# Patient Record
Sex: Female | Born: 1970 | Race: Black or African American | Hispanic: No | Marital: Single | Smoking: Never smoker
Health system: Southern US, Community
[De-identification: ages and names within clinical notes are randomized; demographics above are authoritative.]

## PROBLEM LIST (undated history)

## (undated) DIAGNOSIS — H548 Legal blindness, as defined in USA: Secondary | ICD-10-CM

## (undated) DIAGNOSIS — E785 Hyperlipidemia, unspecified: Secondary | ICD-10-CM

## (undated) DIAGNOSIS — F79 Unspecified intellectual disabilities: Secondary | ICD-10-CM

## (undated) DIAGNOSIS — E119 Type 2 diabetes mellitus without complications: Secondary | ICD-10-CM

## (undated) DIAGNOSIS — H269 Unspecified cataract: Secondary | ICD-10-CM

## (undated) DIAGNOSIS — M2060 Acquired deformities of toe(s), unspecified, unspecified foot: Secondary | ICD-10-CM

## (undated) DIAGNOSIS — I10 Essential (primary) hypertension: Secondary | ICD-10-CM

## (undated) DIAGNOSIS — F419 Anxiety disorder, unspecified: Secondary | ICD-10-CM

## (undated) DIAGNOSIS — Q02 Microcephaly: Secondary | ICD-10-CM

## (undated) DIAGNOSIS — M2042 Other hammer toe(s) (acquired), left foot: Secondary | ICD-10-CM

## (undated) DIAGNOSIS — G40909 Epilepsy, unspecified, not intractable, without status epilepticus: Secondary | ICD-10-CM

## (undated) HISTORY — DX: Microcephaly: Q02

## (undated) HISTORY — DX: Epilepsy, unspecified, not intractable, without status epilepticus: G40.909

## (undated) HISTORY — DX: Anxiety disorder, unspecified: F41.9

## (undated) HISTORY — DX: Hyperlipidemia, unspecified: E78.5

## (undated) HISTORY — DX: Unspecified intellectual disabilities: F79

## (undated) HISTORY — PX: NO PAST SURGERIES: SHX2092

## (undated) HISTORY — DX: Type 2 diabetes mellitus without complications: E11.9

## (undated) HISTORY — DX: Unspecified cataract: H26.9

---

## 2005-02-10 ENCOUNTER — Emergency Department: Payer: Self-pay | Admitting: Emergency Medicine

## 2009-07-20 ENCOUNTER — Ambulatory Visit: Payer: Self-pay | Admitting: Internal Medicine

## 2012-06-16 LAB — HM PAP SMEAR: HM Pap smear: NEGATIVE

## 2014-01-09 DIAGNOSIS — E785 Hyperlipidemia, unspecified: Secondary | ICD-10-CM | POA: Insufficient documentation

## 2014-01-09 DIAGNOSIS — I10 Essential (primary) hypertension: Secondary | ICD-10-CM | POA: Insufficient documentation

## 2014-01-09 DIAGNOSIS — E1122 Type 2 diabetes mellitus with diabetic chronic kidney disease: Secondary | ICD-10-CM | POA: Insufficient documentation

## 2014-01-09 DIAGNOSIS — H548 Legal blindness, as defined in USA: Secondary | ICD-10-CM | POA: Insufficient documentation

## 2014-01-09 DIAGNOSIS — G40909 Epilepsy, unspecified, not intractable, without status epilepticus: Secondary | ICD-10-CM | POA: Insufficient documentation

## 2014-01-09 DIAGNOSIS — N182 Chronic kidney disease, stage 2 (mild): Secondary | ICD-10-CM

## 2014-01-09 DIAGNOSIS — F79 Unspecified intellectual disabilities: Secondary | ICD-10-CM | POA: Insufficient documentation

## 2014-06-26 ENCOUNTER — Emergency Department: Payer: Self-pay | Admitting: Emergency Medicine

## 2015-07-26 ENCOUNTER — Encounter: Payer: Self-pay | Admitting: Obstetrics and Gynecology

## 2016-07-01 NOTE — Progress Notes (Signed)
ANNUAL PREVENTATIVE CARE GYN  ENCOUNTER NOTE  Subjective:       Joann Mcconnell is a 45 y.o. G0P0000 female here for a routine annual gynecologic exam.  Current complaints: Joann Mcconnell patient with multiple comorbidities including microcephaly, seizure disorder, mental retardation  Patient is not sexually active. No abnormal uterine bleeding, vaginal discharge or vulvar issues are identified. Bowel function is normal. Bladder incontinence occurs periodically.   Gynecologic History No LMP recorded. Contraception: none Last Pap: 2013 neg/neg.  Last mammogram: due to pts noncompliance with mammo- recommend withholding testing unless palpable abnormality/visual abnormality. Results were: Marland Kitchen.  Obstetric History OB History  Gravida Para Term Preterm AB Living  0 0 0 0 0 0  SAB TAB Ectopic Multiple Live Births  0 0 0 0          Past Medical History:  Diagnosis Date  . Diabetes mellitus without complication (HCC)   . Microcephaly (HCC)   . MR (mental retardation)   . Seizure disorder [name](HCC)     Past Surgical History:  Procedure Laterality Date  . NO PAST SURGERIES      Current Outpatient Prescriptions on File Prior to Visit  Medication Sig Dispense Refill  . cetirizine (ZYRTEC) 10 MG tablet Take 10 mg by mouth daily.    . citalopram (CELEXA) 10 MG tablet Take 10 mg by mouth daily.    Marland Kitchen. docusate sodium (COLACE) 100 MG capsule Take 100 mg by mouth 2 (two) times daily.    Marland Kitchen. glimepiride (AMARYL) 4 MG tablet Take 4 mg by mouth daily with breakfast.    . ibuprofen (ADVIL,MOTRIN) 200 MG tablet Take 200 mg by mouth every 6 (six) hours as needed.    Marland Kitchen. LORazepam (ATIVAN) 1 MG tablet Take 1 mg by mouth every 8 (eight) hours.    . pravastatin (PRAVACHOL) 80 MG tablet Take 80 mg by mouth daily.    Marland Kitchen. tolnaftate (TINACTIN) 1 % powder Apply 1 application topically 2 (two) times daily.    . traZODone (DESYREL) 50 MG tablet Take 50 mg by mouth at bedtime.     No current facility-administered  medications on file prior to visit.     Allergies no known allergies  Social History   Social History  . Marital status: Single    Spouse name: N/A  . Number of children: N/A  . Years of education: N/A   Occupational History  . Not on file.   Social History Main Topics  . Smoking status: Never Smoker  . Smokeless tobacco: Not on file  . Alcohol use No  . Drug use: No  . Sexual activity: No   Other Topics Concern  . Not on file   Social History Narrative  . No narrative on file    Family History  Problem Relation Age of Onset  . Family history unknown: Yes    The following portions of the patient's history were reviewed and updated as appropriate: allergies, current medications, past family history, past medical history, past social history, past surgical history and problem list.  Review of Systems ROS Not obtainable No reported issues from support personnel regarding bowel or bladder dysfunction, vaginal complaints, or abnormal uterine bleeding   Objective:   BP 116/64   Pulse 80   Wt 70 lb (31.8 kg)   LMP 08/19/2013  CONSTITUTIONAL:Disabled patient with microcephaly, compliant with head and neck exam PSYCHIATRIC: Mental retardation changes; response to requests for lying down NEUROLGIC: Abdominal rigidity due to voluntary guarding HENT:  Normocephalic, atraumatic EYES: Conjunctivae and EOM are normal. Pupils are equal, round, and reactive to light. No scleral icterus.  NECK: Normal range of motion, supple, no masses.  Normal thyroid.  SKIN: Skin is warm and dry. No rash noted. Not diaphoretic. No erythema. No pallor. CARDIOVASCULAR: Mild tachycardia rate noted, regular rhythm, no murmur. RESPIRATORY: Clear to auscultation bilaterally. Effort and breath sounds normal, no problems with respiration noted. BREASTS: Symmetric in size. No masses, skin changes, nipple drainage, or lymphadenopathy. ABDOMEN: Soft, normal bowel sounds, no distention noted.  No  tenderness,. Voluntary guarding present BLADDER: Normal PELVIC:  External Genitalia: Normal  BUS: Normal  Vagina: Did not examine  Cervix: Did not examine  Uterus: Did not examine  Adnexa: Did not examine  RV: External Exam NormaI  MUSCULOSKELETAL: Normal range of motion. No tenderness.  No cyanosis, clubbing, or edema.  2+ distal pulses. LYMPHATIC: No Axillary, Supraclavicular, or Inguinal Adenopathy.    Assessment:   Annual gynecologic examination 45 y.o. Contraception: abstinence Normal BMI Problem List Items Addressed This Visit    None    Visit Diagnoses    Well woman exam with routine gynecological exam    -  Primary   Surveillance of contraceptive pill       Microcephaly (HCC)       Seizure disorder (HCC)          Plan:  Pap: Not needed last Pap 2013 Mammogram: Ordered if patient is compliant with testing Stool Guaiac Testing:  Not Indicated Labs: thru pcp Routine preventative health maintenance measures emphasized: With caregivers Return to Clinic - 1 Year   Crystal CaryMiller, [place]  Herold HarmsMartin A Naleah Kofoed, MD  Note: This dictation was prepared with Dragon dictation along with smaller phrase technology. Any transcriptional errors that result from this process are unintentional.

## 2016-07-03 ENCOUNTER — Encounter: Payer: Self-pay | Admitting: Obstetrics and Gynecology

## 2016-07-03 ENCOUNTER — Ambulatory Visit (INDEPENDENT_AMBULATORY_CARE_PROVIDER_SITE_OTHER): Payer: Medicare Other | Admitting: Obstetrics and Gynecology

## 2016-07-03 VITALS — BP 116/64 | HR 80 | Wt <= 1120 oz

## 2016-07-03 DIAGNOSIS — G40909 Epilepsy, unspecified, not intractable, without status epilepticus: Secondary | ICD-10-CM | POA: Diagnosis not present

## 2016-07-03 DIAGNOSIS — Z01419 Encounter for gynecological examination (general) (routine) without abnormal findings: Secondary | ICD-10-CM

## 2016-07-03 DIAGNOSIS — Q02 Microcephaly: Secondary | ICD-10-CM | POA: Diagnosis not present

## 2016-07-03 DIAGNOSIS — Z0000 Encounter for general adult medical examination without abnormal findings: Secondary | ICD-10-CM

## 2016-07-03 NOTE — Patient Instructions (Signed)
1. Mammogram is ordered 2. No Pap smear is obtained (so long as there are no gynecologic issues such as abnormal vaginal bleeding, vaginal discharge, or history of active sexual activity, I am not recommending internal exams with Pap smears due to perceived trauma for this patient) 3. Return in 1 year for follow-up

## 2016-08-06 ENCOUNTER — Other Ambulatory Visit: Payer: Self-pay | Admitting: Obstetrics and Gynecology

## 2016-08-06 DIAGNOSIS — Z1231 Encounter for screening mammogram for malignant neoplasm of breast: Secondary | ICD-10-CM

## 2016-09-09 ENCOUNTER — Ambulatory Visit
Admission: RE | Admit: 2016-09-09 | Discharge: 2016-09-09 | Disposition: A | Discharge status: Home / Self Care | Payer: Medicare Other | Source: Ambulatory Visit | Attending: Obstetrics and Gynecology | Admitting: Obstetrics and Gynecology

## 2016-09-09 DIAGNOSIS — Z1231 Encounter for screening mammogram for malignant neoplasm of breast: Secondary | ICD-10-CM | POA: Insufficient documentation

## 2017-07-07 NOTE — Progress Notes (Signed)
ANNUAL PREVENTATIVE CARE GYN  ENCOUNTER NOTE  Subjective:       Joann Mcconnell is a 46 y.o. G0P0000 female here for a routine annual gynecologic exam.  Current complaints: none  Anselm PancoastRalph Scott patient with multiple comorbidities including microcephaly, seizure disorder, mental retardation  Patient is not sexually active. No abnormal uterine bleeding, vaginal discharge or vulvar issues are identified. Bowel function is normal. Bladder incontinence occurs periodically. Bonita QuinLinda did get her mammogram this year!   Gynecologic History Patient's last menstrual period was 08/19/2013. Contraception: none Last Pap: 2013 neg/neg.  Last mammogram: due to pts noncompliance with mammo- recommend withholding testing unless palpable abnormality/visual abnormality.  Obstetric History OB History  Gravida Para Term Preterm AB Living  0 0 0 0 0 0  SAB TAB Ectopic Multiple Live Births  0 0 0 0          Past Medical History:  Diagnosis Date  . Anxiety   . Diabetes mellitus without complication (HCC)   . Hyperlipemia   . Microcephaly (HCC)   . MR (mental retardation)   . Seizure disorder [facility](HCC)     Past Surgical History:  Procedure Laterality Date  . NO PAST SURGERIES      Current Outpatient Medications on File Prior to Visit  Medication Sig Dispense Refill  . Blood Glucose Monitoring Suppl (GLUCOCOM BLOOD GLUCOSE MONITOR) DEVI 1 each by XX route as directed.    . cetirizine (ZYRTEC) 10 MG tablet Take 10 mg by mouth daily.    . citalopram (CELEXA) 10 MG tablet Take 10 mg by mouth daily.    Marland Kitchen. docusate sodium (COLACE) 100 MG capsule Take 100 mg by mouth 2 (two) times daily.    . feeding supplement, GLUCERNA SHAKE, (GLUCERNA SHAKE) LIQD DRINK 120 ML BY MOUTH 3 TIMES DAILY WITHMEALS AND AT 2PM FOR SNACK. MAY HAVE OTHER SNACK CHOICES 1/2 CHEESE OR MEAT SANDWICH AT 2PM.    . glimepiride (AMARYL) 4 MG tablet Take 4 mg by mouth daily with breakfast.    . glucose blood (BAYER CONTOUR TEST) test strip  Use once daily. To check blood sugar  Dx code: E11.9    . Insulin Glargine (LANTUS SOLOSTAR) 100 UNIT/ML Solostar Pen INJECT 9 UNITS SQ ONCE DAILY AT BEDTIME    . Insulin Pen Needle (EASY TOUCH PEN NEEDLES) 31G X 6 MM MISC USE WITH LANTUS DAILY    . LORazepam (ATIVAN) 1 MG tablet Take 1 mg by mouth every 8 (eight) hours.    Marland Kitchen. losartan (COZAAR) 25 MG tablet TAKE 1 TABLET BY MOUTH ONCE DAILY FOR BP    . pravastatin (PRAVACHOL) 80 MG tablet Take 80 mg by mouth daily.    Marland Kitchen. tolnaftate (TINACTIN) 1 % powder Apply 1 application topically 2 (two) times daily.    . traZODone (DESYREL) 50 MG tablet Take 50 mg by mouth at bedtime.     No current facility-administered medications on file prior to visit.     No Known Allergies  Social History   Socioeconomic History  . Marital status: Single    Spouse name: Not on file  . Number of children: Not on file  . Years of education: Not on file  . Highest education level: Not on file  Social Needs  . Financial resource strain: Not on file  . Food insecurity - worry: Not on file  . Food insecurity - inability: Not on file  . Transportation needs - medical: Not on file  . Transportation needs - non-medical:  Not on file  Occupational History  . Not on file  Tobacco Use  . Smoking status: Never Smoker  . Smokeless tobacco: Never Used  Substance and Sexual Activity  . Alcohol use: No  . Drug use: No  . Sexual activity: No  Other Topics Concern  . Not on file  Social History Narrative  . Not on file    Family History  Family history unknown: Yes    The following portions of the patient's history were reviewed and updated as appropriate: allergies, current medications, past family history, past medical history, past social history, past surgical history and problem list.  Review of Systems ROS Not obtainable No reported issues from support personnel regarding bowel or bladder dysfunction, vaginal complaints, or abnormal uterine bleeding    Objective:   BP (!) 167/99   Pulse 92   Wt 69 lb (31.3 kg)   LMP 08/19/2013  CONSTITUTIONAL:Disabled patient with microcephaly, compliant with head and neck exam PSYCHIATRIC: Mental retardation changes; response to requests for lying down NEUROLGIC: Abdominal rigidity due to voluntary guarding HENT:  Normocephalic, atraumatic EYES:  No scleral icterus.  NECK: Normal range of motion, supple, no masses.  Normal thyroid.  SKIN: Skin is warm and dry. No rash noted. Not diaphoretic. No erythema. No pallor. CARDIOVASCULAR: Mild tachycardia rate noted, regular rhythm, no murmur. RESPIRATORY: Clear to auscultation bilaterally. Effort and breath sounds normal, no problems with respiration noted. BREASTS: Symmetric in size. No masses, skin changes, nipple drainage, or lymphadenopathy. ABDOMEN:  Voluntary guarding present; no palpable masses BLADDER: Normal PELVIC:  External Genitalia: Normal  BUS: Normal  Vagina: Did not examine-unable to because of patient noncompliance  Cervix: Did not examine  Uterus: Did not examine  Adnexa: Did not examine  RV: External Exam NormaI no evidence of rash or irritation MUSCULOSKELETAL: Normal range of motion. No tenderness.  No cyanosis, clubbing, or edema.  Warm and dry extremities LYMPHATIC: No Axillary, Supraclavicular, or Inguinal Adenopathy.    Assessment:   Annual gynecologic examination 46 y.o. Contraception: abstinence Normal BMI Problem List Items Addressed This Visit    None    Visit Diagnoses    Well woman exam with routine gynecological exam    -  Primary   Microcephaly (HCC)       Seizure disorder (HCC)          Plan:  Pap: Not needed last Pap 2013 Mammogram: Ordered if patient is compliant with testing Stool Guaiac Testing:  Not Indicated Labs: thru pcp Routine preventative health maintenance measures emphasized: With caregivers Return to Clinic - 1 Year   Crystal Bealls[place]Miller, [place]  Herold HarmsMartin A Hakim Minniefield, MD  Note: This  dictation was prepared with Dragon dictation along with smaller phrase technology. Any transcriptional errors that result from this process are unintentional.

## 2017-07-08 ENCOUNTER — Ambulatory Visit (INDEPENDENT_AMBULATORY_CARE_PROVIDER_SITE_OTHER): Payer: Medicare Other | Admitting: Obstetrics and Gynecology

## 2017-07-08 ENCOUNTER — Encounter: Payer: Self-pay | Admitting: Obstetrics and Gynecology

## 2017-07-08 VITALS — BP 167/99 | HR 92 | Wt <= 1120 oz

## 2017-07-08 DIAGNOSIS — Z01419 Encounter for gynecological examination (general) (routine) without abnormal findings: Secondary | ICD-10-CM | POA: Diagnosis not present

## 2017-07-08 DIAGNOSIS — Q02 Microcephaly: Secondary | ICD-10-CM | POA: Diagnosis not present

## 2017-07-08 DIAGNOSIS — Z780 Asymptomatic menopausal state: Secondary | ICD-10-CM

## 2017-07-08 DIAGNOSIS — G40909 Epilepsy, unspecified, not intractable, without status epilepticus: Secondary | ICD-10-CM

## 2017-07-08 NOTE — Patient Instructions (Addendum)
1.  No Pap smear is done.  Pap smear is not indicated in the future unless vaginal bleeding or vaginal discharge develops; any exam will need to be done under anesthesia 2.  Mammogram already done 3.  Screening labs are to be obtained to her primary care 4.  Notify this office if any gynecologic or breast issues develop 5.  Return to clinic in 1 year   Health Maintenance for Postmenopausal Women Menopause is a normal process in which your reproductive ability comes to an end. This process happens gradually over a span of months to years, usually between the ages of 66 and 62. Menopause is complete when you have missed 12 consecutive menstrual periods. It is important to talk with your health care provider about some of the most common conditions that affect postmenopausal women, such as heart disease, cancer, and bone loss (osteoporosis). Adopting a healthy lifestyle and getting preventive care can help to promote your health and wellness. Those actions can also lower your chances of developing some of these common conditions. What should I know about menopause? During menopause, you may experience a number of symptoms, such as:  Moderate-to-severe hot flashes.  Night sweats.  Decrease in sex drive.  Mood swings.  Headaches.  Tiredness.  Irritability.  Memory problems.  Insomnia.  Choosing to treat or not to treat menopausal changes is an individual decision that you make with your health care provider. What should I know about hormone replacement therapy and supplements? Hormone therapy products are effective for treating symptoms that are associated with menopause, such as hot flashes and night sweats. Hormone replacement carries certain risks, especially as you become older. If you are thinking about using estrogen or estrogen with progestin treatments, discuss the benefits and risks with your health care provider. What should I know about heart disease and stroke? Heart  disease, heart attack, and stroke become more likely as you age. This may be due, in part, to the hormonal changes that your body experiences during menopause. These can affect how your body processes dietary fats, triglycerides, and cholesterol. Heart attack and stroke are both medical emergencies. There are many things that you can do to help prevent heart disease and stroke:  Have your blood pressure checked at least every 1-2 years. High blood pressure causes heart disease and increases the risk of stroke.  If you are 42-76 years old, ask your health care provider if you should take aspirin to prevent a heart attack or a stroke.  Do not use any tobacco products, including cigarettes, chewing tobacco, or electronic cigarettes. If you need help quitting, ask your health care provider.  It is important to eat a healthy diet and maintain a healthy weight. ? Be sure to include plenty of vegetables, fruits, low-fat dairy products, and lean protein. ? Avoid eating foods that are high in solid fats, added sugars, or salt (sodium).  Get regular exercise. This is one of the most important things that you can do for your health. ? Try to exercise for at least 150 minutes each week. The type of exercise that you do should increase your heart rate and make you sweat. This is known as moderate-intensity exercise. ? Try to do strengthening exercises at least twice each week. Do these in addition to the moderate-intensity exercise.  Know your numbers.Ask your health care provider to check your cholesterol and your blood glucose. Continue to have your blood tested as directed by your health care provider.  What should I  know about cancer screening? There are several types of cancer. Take the following steps to reduce your risk and to catch any cancer development as early as possible. Breast Cancer  Practice breast self-awareness. ? This means understanding how your breasts normally appear and feel. ? It  also means doing regular breast self-exams. Let your health care provider know about any changes, no matter how small.  If you are 24 or older, have a clinician do a breast exam (clinical breast exam or CBE) every year. Depending on your age, family history, and medical history, it may be recommended that you also have a yearly breast X-ray (mammogram).  If you have a family history of breast cancer, talk with your health care provider about genetic screening.  If you are at high risk for breast cancer, talk with your health care provider about having an MRI and a mammogram every year.  Breast cancer (BRCA) gene test is recommended for women who have family members with BRCA-related cancers. Results of the assessment will determine the need for genetic counseling and BRCA1 and for BRCA2 testing. BRCA-related cancers include these types: ? Breast. This occurs in males or females. ? Ovarian. ? Tubal. This may also be called fallopian tube cancer. ? Cancer of the abdominal or pelvic lining (peritoneal cancer). ? Prostate. ? Pancreatic.  Cervical, Uterine, and Ovarian Cancer Your health care provider may recommend that you be screened regularly for cancer of the pelvic organs. These include your ovaries, uterus, and vagina. This screening involves a pelvic exam, which includes checking for microscopic changes to the surface of your cervix (Pap test).  For women ages 21-65, health care providers may recommend a pelvic exam and a Pap test every three years. For women ages 89-65, they may recommend the Pap test and pelvic exam, combined with testing for human papilloma virus (HPV), every five years. Some types of HPV increase your risk of cervical cancer. Testing for HPV may also be done on women of any age who have unclear Pap test results.  Other health care providers may not recommend any screening for nonpregnant women who are considered low risk for pelvic cancer and have no symptoms. Ask your  health care provider if a screening pelvic exam is right for you.  If you have had past treatment for cervical cancer or a condition that could lead to cancer, you need Pap tests and screening for cancer for at least 20 years after your treatment. If Pap tests have been discontinued for you, your risk factors (such as having a new sexual partner) need to be reassessed to determine if you should start having screenings again. Some women have medical problems that increase the chance of getting cervical cancer. In these cases, your health care provider may recommend that you have screening and Pap tests more often.  If you have a family history of uterine cancer or ovarian cancer, talk with your health care provider about genetic screening.  If you have vaginal bleeding after reaching menopause, tell your health care provider.  There are currently no reliable tests available to screen for ovarian cancer.  Lung Cancer Lung cancer screening is recommended for adults 20-19 years old who are at high risk for lung cancer because of a history of smoking. A yearly low-dose CT scan of the lungs is recommended if you:  Currently smoke.  Have a history of at least 30 pack-years of smoking and you currently smoke or have quit within the past 15 years. A  pack-year is smoking an average of one pack of cigarettes per day for one year.  Yearly screening should:  Continue until it has been 15 years since you quit.  Stop if you develop a health problem that would prevent you from having lung cancer treatment.  Colorectal Cancer  This type of cancer can be detected and can often be prevented.  Routine colorectal cancer screening usually begins at age 27 and continues through age 37.  If you have risk factors for colon cancer, your health care provider may recommend that you be screened at an earlier age.  If you have a family history of colorectal cancer, talk with your health care provider about genetic  screening.  Your health care provider may also recommend using home test kits to check for hidden blood in your stool.  A small camera at the end of a tube can be used to examine your colon directly (sigmoidoscopy or colonoscopy). This is done to check for the earliest forms of colorectal cancer.  Direct examination of the colon should be repeated every 5-10 years until age 52. However, if early forms of precancerous polyps or small growths are found or if you have a family history or genetic risk for colorectal cancer, you may need to be screened more often.  Skin Cancer  Check your skin from head to toe regularly.  Monitor any moles. Be sure to tell your health care provider: ? About any new moles or changes in moles, especially if there is a change in a mole's shape or color. ? If you have a mole that is larger than the size of a pencil eraser.  If any of your family members has a history of skin cancer, especially at a young age, talk with your health care provider about genetic screening.  Always use sunscreen. Apply sunscreen liberally and repeatedly throughout the day.  Whenever you are outside, protect yourself by wearing long sleeves, pants, a wide-brimmed hat, and sunglasses.  What should I know about osteoporosis? Osteoporosis is a condition in which bone destruction happens more quickly than new bone creation. After menopause, you may be at an increased risk for osteoporosis. To help prevent osteoporosis or the bone fractures that can happen because of osteoporosis, the following is recommended:  If you are 95-53 years old, get at least 1,000 mg of calcium and at least 600 mg of vitamin D per day.  If you are older than age 38 but younger than age 23, get at least 1,200 mg of calcium and at least 600 mg of vitamin D per day.  If you are older than age 85, get at least 1,200 mg of calcium and at least 800 mg of vitamin D per day.  Smoking and excessive alcohol intake  increase the risk of osteoporosis. Eat foods that are rich in calcium and vitamin D, and do weight-bearing exercises several times each week as directed by your health care provider. What should I know about how menopause affects my mental health? Depression may occur at any age, but it is more common as you become older. Common symptoms of depression include:  Low or sad mood.  Changes in sleep patterns.  Changes in appetite or eating patterns.  Feeling an overall lack of motivation or enjoyment of activities that you previously enjoyed.  Frequent crying spells.  Talk with your health care provider if you think that you are experiencing depression. What should I know about immunizations? It is important that you get  and maintain your immunizations. These include:  Tetanus, diphtheria, and pertussis (Tdap) booster vaccine.  Influenza every year before the flu season begins.  Pneumonia vaccine.  Shingles vaccine.  Your health care provider may also recommend other immunizations. This information is not intended to replace advice given to you by your health care provider. Make sure you discuss any questions you have with your health care provider. Document Released: 09/27/2005 Document Revised: 02/23/2016 Document Reviewed: 05/09/2015 Elsevier Interactive Patient Education  2018 [employer].

## 2017-10-20 ENCOUNTER — Other Ambulatory Visit: Payer: Self-pay | Admitting: Obstetrics and Gynecology

## 2017-10-20 DIAGNOSIS — Z1231 Encounter for screening mammogram for malignant neoplasm of breast: Secondary | ICD-10-CM

## 2017-11-04 ENCOUNTER — Ambulatory Visit
Admission: RE | Admit: 2017-11-04 | Discharge: 2017-11-04 | Disposition: A | Discharge status: Home / Self Care | Payer: Medicare Other | Source: Ambulatory Visit | Attending: Obstetrics and Gynecology | Admitting: Obstetrics and Gynecology

## 2017-11-04 DIAGNOSIS — Z1231 Encounter for screening mammogram for malignant neoplasm of breast: Secondary | ICD-10-CM | POA: Insufficient documentation

## 2018-07-08 NOTE — Progress Notes (Signed)
ANNUAL PREVENTATIVE CARE GYN  ENCOUNTER NOTE  Subjective:       Joann Mcconnell is a 47 y.o. G0P0000 female here for a routine annual gynecologic exam.  Current complaints: none  Joann Mcconnell patient with multiple comorbidities including microcephaly, seizure disorder, mental retardation  Patient is not sexually active. No abnormal uterine bleeding, no vaginal discharge or no vulvar issues have been noted. Bowel function is normal. Bladder incontinence occurs periodically. Joann Mcconnell did get a mammogram this year.   Gynecologic History Patient's last menstrual period was 08/19/2013. Contraception: none Last Pap: 2013 neg/neg.  Last mammogram: 11/04/2017 BI-RADS 1  Obstetric History OB History  Gravida Para Term Preterm AB Living  0 0 0 0 0 0  SAB TAB Ectopic Multiple Live Births  0 0 0 0      Past Medical History:  Diagnosis Date  . Anxiety   . Diabetes mellitus without complication (HCC)   . Hyperlipemia   . Microcephaly (HCC)   . MR (mental retardation)   . Seizure disorder [facility](HCC)     Past Surgical History:  Procedure Laterality Date  . NO PAST SURGERIES      Current Outpatient Medications on File Prior to Visit  Medication Sig Dispense Refill  . Blood Glucose Monitoring Suppl (GLUCOCOM BLOOD GLUCOSE MONITOR) DEVI 1 each by XX route as directed.    . cetirizine (ZYRTEC) 10 MG tablet Take 10 mg by mouth daily.    . citalopram (CELEXA) 10 MG tablet Take 10 mg by mouth daily.    Marland Kitchen. docusate sodium (COLACE) 100 MG capsule Take 100 mg by mouth 2 (two) times daily.    . feeding supplement, GLUCERNA SHAKE, (GLUCERNA SHAKE) LIQD DRINK 120 ML BY MOUTH 3 TIMES DAILY WITHMEALS AND AT 2PM FOR SNACK. MAY HAVE OTHER SNACK CHOICES 1/2 CHEESE OR MEAT SANDWICH AT 2PM.    . glimepiride (AMARYL) 4 MG tablet Take 4 mg by mouth daily with breakfast.    . glucose blood (BAYER CONTOUR TEST) test strip Use once daily. To check blood sugar  Dx code: E11.9    . Insulin Glargine (LANTUS SOLOSTAR) 100  UNIT/ML Solostar Pen INJECT 9 UNITS SQ ONCE DAILY AT BEDTIME    . Insulin Pen Needle (EASY TOUCH PEN NEEDLES) 31G X 6 MM MISC USE WITH LANTUS DAILY    . LORazepam (ATIVAN) 1 MG tablet Take 1 mg by mouth every 8 (eight) hours.    Marland Kitchen. losartan (COZAAR) 25 MG tablet TAKE 1 TABLET BY MOUTH ONCE DAILY FOR BP    . pravastatin (PRAVACHOL) 80 MG tablet Take 80 mg by mouth daily.    Marland Kitchen. tolnaftate (TINACTIN) 1 % powder Apply 1 application topically 2 (two) times daily.    . traZODone (DESYREL) 50 MG tablet Take 50 mg by mouth at bedtime.     No current facility-administered medications on file prior to visit.     No Known Allergies  Social History   Socioeconomic History  . Marital status: Single    Spouse name: Not on file  . Number of children: Not on file  . Years of education: Not on file  . Highest education level: Not on file  Occupational History  . Not on file  Social Needs  . Financial resource strain: Not on file  . Food insecurity:    Worry: Not on file    Inability: Not on file  . Transportation needs:    Medical: Not on file    Non-medical: Not on file  Tobacco Use  . Smoking status: Never Smoker  . Smokeless tobacco: Never Used  Substance and Sexual Activity  . Alcohol use: No  . Drug use: No  . Sexual activity: Never  Lifestyle  . Physical activity:    Days per week: Not on file    Minutes per session: Not on file  . Stress: Not on file  Relationships  . Social connections:    Talks on phone: Not on file    Gets together: Not on file    Attends religious service: Not on file    Active member of club or organization: Not on file    Attends meetings of clubs or organizations: Not on file    Relationship status: Not on file  . Intimate partner violence:    Fear of current or ex partner: Not on file    Emotionally abused: Not on file    Physically abused: Not on file    Forced sexual activity: Not on file  Other Topics Concern  . Not on file  Social History  Narrative  . Not on file    Family History  Family history unknown: Yes    The following portions of the patient's history were reviewed and updated as appropriate: allergies, current medications, past family history, past medical history, past social history, past surgical history and problem list.  Review of Systems ROS Not obtainable No reported issues from support personnel regarding bowel or bladder dysfunction, vaginal complaints, or abnormal uterine bleeding   Objective:   BP (!) 149/84   Wt 73 lb (33.1 kg)   LMP 08/19/2013  CONSTITUTIONAL:Disabled patient with microcephaly, compliant with head and neck exam PSYCHIATRIC: Mental retardation changes; response to requests for lying down with assistance from health care providers NEUROLGIC: Abdominal rigidity due to voluntary guarding HENT:  Normocephalic, atraumatic EYES:  No scleral icterus.  NECK: Normal range of motion, supple, no masses.  Normal thyroid.  SKIN: Skin is warm and dry. No rash noted. Not diaphoretic. No erythema. No pallor. CARDIOVASCULAR: Mild tachycardia rate noted, regular rhythm, no murmur. RESPIRATORY: Clear to auscultation bilaterally. Effort and breath sounds normal, no problems with respiration noted. BREASTS: Symmetric in size. No masses, skin changes, nipple drainage, or lymphadenopathy. ABDOMEN:  Voluntary guarding present; no palpable masses BLADDER: Normal PELVIC:  External Genitalia: Normal  BUS: Normal  Vagina: Did not examine-unable to because of patient noncompliance  Cervix: Did not examine  Uterus: Did not examine  Adnexa: Did not examine  RV: External Exam NormaI no evidence of rash or irritation MUSCULOSKELETAL: Normal range of motion. No tenderness.  No cyanosis, clubbing, or edema.  Warm and dry extremities LYMPHATIC: No Axillary, Supraclavicular, or Inguinal Adenopathy.    Assessment:   Annual gynecologic examination 47 y.o. Contraception: abstinence Normal  BMI Microcephaly Seizure disorder Mental retardation  Plan:  Pap: Not needed last Pap 2013 Mammogram: Completed this year  Stool Guaiac Testing:  Not Indicated Labs: thru pcp Routine preventative health maintenance measures emphasized: With caregivers Return to Clinic - 1 Year  Sheliah Plane [place], CMA   Britt Bottom CMA acting as scribe for Dr. Greggory Keen. I have reviewed, updated, and concur with the information scribed. Herold Harms, MD  Note: This dictation was prepared with Dragon dictation along with smaller phrase technology. Any transcriptional errors that result from this process are unintentional.

## 2018-07-09 ENCOUNTER — Ambulatory Visit (INDEPENDENT_AMBULATORY_CARE_PROVIDER_SITE_OTHER): Payer: Medicare Other | Admitting: Obstetrics and Gynecology

## 2018-07-09 ENCOUNTER — Encounter: Payer: Self-pay | Admitting: Obstetrics and Gynecology

## 2018-07-09 VITALS — BP 149/84 | Wt 73.0 lb

## 2018-07-09 DIAGNOSIS — Q02 Microcephaly: Secondary | ICD-10-CM

## 2018-07-09 DIAGNOSIS — Z780 Asymptomatic menopausal state: Secondary | ICD-10-CM

## 2018-07-09 DIAGNOSIS — Z01419 Encounter for gynecological examination (general) (routine) without abnormal findings: Secondary | ICD-10-CM | POA: Diagnosis not present

## 2018-07-09 DIAGNOSIS — F79 Unspecified intellectual disabilities: Secondary | ICD-10-CM

## 2018-07-09 DIAGNOSIS — Z0000 Encounter for general adult medical examination without abnormal findings: Secondary | ICD-10-CM

## 2018-07-09 DIAGNOSIS — G40909 Epilepsy, unspecified, not intractable, without status epilepticus: Secondary | ICD-10-CM

## 2018-07-09 NOTE — Patient Instructions (Addendum)
1.  Pap smear is not done. 2.  Mammogram already obtained 3.  Screening labs are obtained through primary care 4.  Return in 1 year for annual exam-Dr. [place] Maintenance for Postmenopausal Women Menopause is a normal process in which your reproductive ability comes to an end. This process happens gradually over a span of months to years, usually between the ages of 70 and 57. Menopause is complete when you have missed 12 consecutive menstrual periods. It is important to talk with your health care provider about some of the most common conditions that affect postmenopausal women, such as heart disease, cancer, and bone loss (osteoporosis). Adopting a healthy lifestyle and getting preventive care can help to promote your health and wellness. Those actions can also lower your chances of developing some of these common conditions. What should I know about menopause? During menopause, you may experience a number of symptoms, such as:  Moderate-to-severe hot flashes.  Night sweats.  Decrease in sex drive.  Mood swings.  Headaches.  Tiredness.  Irritability.  Memory problems.  Insomnia.  Choosing to treat or not to treat menopausal changes is an individual decision that you make with your health care provider. What should I know about hormone replacement therapy and supplements? Hormone therapy products are effective for treating symptoms that are associated with menopause, such as hot flashes and night sweats. Hormone replacement carries certain risks, especially as you become older. If you are thinking about using estrogen or estrogen with progestin treatments, discuss the benefits and risks with your health care provider. What should I know about heart disease and stroke? Heart disease, heart attack, and stroke become more likely as you age. This may be due, in part, to the hormonal changes that your body experiences during menopause. These can affect how your body processes  dietary fats, triglycerides, and cholesterol. Heart attack and stroke are both medical emergencies. There are many things that you can do to help prevent heart disease and stroke:  Have your blood pressure checked at least every 1-2 years. High blood pressure causes heart disease and increases the risk of stroke.  If you are 79-61 years old, ask your health care provider if you should take aspirin to prevent a heart attack or a stroke.  Do not use any tobacco products, including cigarettes, chewing tobacco, or electronic cigarettes. If you need help quitting, ask your health care provider.  It is important to eat a healthy diet and maintain a healthy weight. ? Be sure to include plenty of vegetables, fruits, low-fat dairy products, and lean protein. ? Avoid eating foods that are high in solid fats, added sugars, or salt (sodium).  Get regular exercise. This is one of the most important things that you can do for your health. ? Try to exercise for at least 150 minutes each week. The type of exercise that you do should increase your heart rate and make you sweat. This is known as moderate-intensity exercise. ? Try to do strengthening exercises at least twice each week. Do these in addition to the moderate-intensity exercise.  Know your numbers.Ask your health care provider to check your cholesterol and your blood glucose. Continue to have your blood tested as directed by your health care provider.  What should I know about cancer screening? There are several types of cancer. Take the following steps to reduce your risk and to catch any cancer development as early as possible. Breast Cancer  Practice breast self-awareness. ? This means understanding  how your breasts normally appear and feel. ? It also means doing regular breast self-exams. Let your health care provider know about any changes, no matter how small.  If you are 48 or older, have a clinician do a breast exam (clinical breast  exam or CBE) every year. Depending on your age, family history, and medical history, it may be recommended that you also have a yearly breast X-ray (mammogram).  If you have a family history of breast cancer, talk with your health care provider about genetic screening.  If you are at high risk for breast cancer, talk with your health care provider about having an MRI and a mammogram every year.  Breast cancer (BRCA) gene test is recommended for women who have family members with BRCA-related cancers. Results of the assessment will determine the need for genetic counseling and BRCA1 and for BRCA2 testing. BRCA-related cancers include these types: ? Breast. This occurs in males or females. ? Ovarian. ? Tubal. This may also be called fallopian tube cancer. ? Cancer of the abdominal or pelvic lining (peritoneal cancer). ? Prostate. ? Pancreatic.  Cervical, Uterine, and Ovarian Cancer Your health care provider may recommend that you be screened regularly for cancer of the pelvic organs. These include your ovaries, uterus, and vagina. This screening involves a pelvic exam, which includes checking for microscopic changes to the surface of your cervix (Pap test).  For women ages 21-65, health care providers may recommend a pelvic exam and a Pap test every three years. For women ages 43-65, they may recommend the Pap test and pelvic exam, combined with testing for human papilloma virus (HPV), every five years. Some types of HPV increase your risk of cervical cancer. Testing for HPV may also be done on women of any age who have unclear Pap test results.  Other health care providers may not recommend any screening for nonpregnant women who are considered low risk for pelvic cancer and have no symptoms. Ask your health care provider if a screening pelvic exam is right for you.  If you have had past treatment for cervical cancer or a condition that could lead to cancer, you need Pap tests and screening for  cancer for at least 20 years after your treatment. If Pap tests have been discontinued for you, your risk factors (such as having a new sexual partner) need to be reassessed to determine if you should start having screenings again. Some women have medical problems that increase the chance of getting cervical cancer. In these cases, your health care provider may recommend that you have screening and Pap tests more often.  If you have a family history of uterine cancer or ovarian cancer, talk with your health care provider about genetic screening.  If you have vaginal bleeding after reaching menopause, tell your health care provider.  There are currently no reliable tests available to screen for ovarian cancer.  Lung Cancer Lung cancer screening is recommended for adults 11-30 years old who are at high risk for lung cancer because of a history of smoking. A yearly low-dose CT scan of the lungs is recommended if you:  Currently smoke.  Have a history of at least 30 pack-years of smoking and you currently smoke or have quit within the past 15 years. A pack-year is smoking an average of one pack of cigarettes per day for one year.  Yearly screening should:  Continue until it has been 15 years since you quit.  Stop if you develop a health problem  that would prevent you from having lung cancer treatment.  Colorectal Cancer  This type of cancer can be detected and can often be prevented.  Routine colorectal cancer screening usually begins at age 17 and continues through age 61.  If you have risk factors for colon cancer, your health care provider may recommend that you be screened at an earlier age.  If you have a family history of colorectal cancer, talk with your health care provider about genetic screening.  Your health care provider may also recommend using home test kits to check for hidden blood in your stool.  A small camera at the end of a tube can be used to examine your colon  directly (sigmoidoscopy or colonoscopy). This is done to check for the earliest forms of colorectal cancer.  Direct examination of the colon should be repeated every 5-10 years until age 48. However, if early forms of precancerous polyps or small growths are found or if you have a family history or genetic risk for colorectal cancer, you may need to be screened more often.  Skin Cancer  Check your skin from head to toe regularly.  Monitor any moles. Be sure to tell your health care provider: ? About any new moles or changes in moles, especially if there is a change in a mole's shape or color. ? If you have a mole that is larger than the size of a pencil eraser.  If any of your family members has a history of skin cancer, especially at a young age, talk with your health care provider about genetic screening.  Always use sunscreen. Apply sunscreen liberally and repeatedly throughout the day.  Whenever you are outside, protect yourself by wearing long sleeves, pants, a wide-brimmed hat, and sunglasses.  What should I know about osteoporosis? Osteoporosis is a condition in which bone destruction happens more quickly than new bone creation. After menopause, you may be at an increased risk for osteoporosis. To help prevent osteoporosis or the bone fractures that can happen because of osteoporosis, the following is recommended:  If you are 63-31 years old, get at least 1,000 mg of calcium and at least 600 mg of vitamin D per day.  If you are older than age 38 but younger than age 106, get at least 1,200 mg of calcium and at least 600 mg of vitamin D per day.  If you are older than age 60, get at least 1,200 mg of calcium and at least 800 mg of vitamin D per day.  Smoking and excessive alcohol intake increase the risk of osteoporosis. Eat foods that are rich in calcium and vitamin D, and do weight-bearing exercises several times each week as directed by your health care provider. What should I  know about how menopause affects my mental health? Depression may occur at any age, but it is more common as you become older. Common symptoms of depression include:  Low or sad mood.  Changes in sleep patterns.  Changes in appetite or eating patterns.  Feeling an overall lack of motivation or enjoyment of activities that you previously enjoyed.  Frequent crying spells.  Talk with your health care provider if you think that you are experiencing depression. What should I know about immunizations? It is important that you get and maintain your immunizations. These include:  Tetanus, diphtheria, and pertussis (Tdap) booster vaccine.  Influenza every year before the flu season begins.  Pneumonia vaccine.  Shingles vaccine.  Your health care provider may also recommend other  immunizations. This information is not intended to replace advice given to you by your health care provider. Make sure you discuss any questions you have with your health care provider. Document Released: 09/27/2005 Document Revised: 02/23/2016 Document Reviewed: 05/09/2015 Elsevier Interactive Patient Education  2018 [employer].

## 2018-10-19 ENCOUNTER — Other Ambulatory Visit: Payer: Self-pay | Admitting: Internal Medicine

## 2018-10-19 DIAGNOSIS — Z1231 Encounter for screening mammogram for malignant neoplasm of breast: Secondary | ICD-10-CM

## 2018-11-08 ENCOUNTER — Encounter: Payer: Self-pay | Admitting: Emergency Medicine

## 2018-11-08 ENCOUNTER — Other Ambulatory Visit: Payer: Self-pay

## 2018-11-08 ENCOUNTER — Emergency Department: Payer: Medicare Other

## 2018-11-08 ENCOUNTER — Inpatient Hospital Stay
Admission: EM | Admit: 2018-11-08 | Discharge: 2018-11-12 | DRG: 623 | Disposition: A | Discharge status: Nursing Home (Includes ICF) | Payer: Medicare Other | Attending: Internal Medicine | Admitting: Internal Medicine

## 2018-11-08 DIAGNOSIS — A4902 Methicillin resistant Staphylococcus aureus infection, unspecified site: Secondary | ICD-10-CM

## 2018-11-08 DIAGNOSIS — I10 Essential (primary) hypertension: Secondary | ICD-10-CM | POA: Diagnosis present

## 2018-11-08 DIAGNOSIS — E785 Hyperlipidemia, unspecified: Secondary | ICD-10-CM | POA: Diagnosis present

## 2018-11-08 DIAGNOSIS — Z79899 Other long term (current) drug therapy: Secondary | ICD-10-CM

## 2018-11-08 DIAGNOSIS — E1140 Type 2 diabetes mellitus with diabetic neuropathy, unspecified: Secondary | ICD-10-CM | POA: Diagnosis present

## 2018-11-08 DIAGNOSIS — E11621 Type 2 diabetes mellitus with foot ulcer: Principal | ICD-10-CM | POA: Diagnosis present

## 2018-11-08 DIAGNOSIS — L97519 Non-pressure chronic ulcer of other part of right foot with unspecified severity: Secondary | ICD-10-CM | POA: Diagnosis present

## 2018-11-08 DIAGNOSIS — F79 Unspecified intellectual disabilities: Secondary | ICD-10-CM | POA: Diagnosis present

## 2018-11-08 DIAGNOSIS — E11628 Type 2 diabetes mellitus with other skin complications: Secondary | ICD-10-CM | POA: Diagnosis present

## 2018-11-08 DIAGNOSIS — M79671 Pain in right foot: Secondary | ICD-10-CM | POA: Diagnosis present

## 2018-11-08 DIAGNOSIS — Z794 Long term (current) use of insulin: Secondary | ICD-10-CM | POA: Diagnosis not present

## 2018-11-08 DIAGNOSIS — Q02 Microcephaly: Secondary | ICD-10-CM | POA: Diagnosis not present

## 2018-11-08 DIAGNOSIS — E1165 Type 2 diabetes mellitus with hyperglycemia: Secondary | ICD-10-CM | POA: Diagnosis present

## 2018-11-08 DIAGNOSIS — E11649 Type 2 diabetes mellitus with hypoglycemia without coma: Secondary | ICD-10-CM | POA: Diagnosis present

## 2018-11-08 DIAGNOSIS — L02611 Cutaneous abscess of right foot: Secondary | ICD-10-CM | POA: Diagnosis present

## 2018-11-08 DIAGNOSIS — L03116 Cellulitis of left lower limb: Secondary | ICD-10-CM

## 2018-11-08 DIAGNOSIS — M7989 Other specified soft tissue disorders: Secondary | ICD-10-CM | POA: Diagnosis present

## 2018-11-08 DIAGNOSIS — Z993 Dependence on wheelchair: Secondary | ICD-10-CM

## 2018-11-08 HISTORY — DX: Methicillin resistant Staphylococcus aureus infection, unspecified site: A49.02

## 2018-11-08 LAB — BASIC METABOLIC PANEL
Anion gap: 10 (ref 5–15)
BUN: 17 mg/dL (ref 6–20)
CHLORIDE: 101 mmol/L (ref 98–111)
CO2: 29 mmol/L (ref 22–32)
CREATININE: 0.84 mg/dL (ref 0.44–1.00)
Calcium: 9.3 mg/dL (ref 8.9–10.3)
GFR calc Af Amer: >60 mL/min (ref >60)
GFR calc non Af Amer: >60 mL/min (ref >60)
Glucose, Bld: 216 mg/dL — ABNORMAL HIGH (ref 70–99)
POTASSIUM: 3.6 mmol/L (ref 3.5–5.1)
Sodium: 140 mmol/L (ref 135–145)

## 2018-11-08 LAB — CBC WITH DIFFERENTIAL/PLATELET
ABS IMMATURE GRANULOCYTES: 0.1 10*3/uL — AB (ref 0.00–0.07)
BASOS ABS: 0 10*3/uL (ref 0.0–0.1)
Basophils Relative: 0 %
EOS PCT: 0 %
Eosinophils Absolute: 0.1 10*3/uL (ref 0.0–0.5)
HEMATOCRIT: 38.5 % (ref 36.0–46.0)
HEMOGLOBIN: 13 g/dL (ref 12.0–15.0)
IMMATURE GRANULOCYTES: 0 %
LYMPHS ABS: 2.1 10*3/uL (ref 0.7–4.0)
LYMPHS PCT: 9 %
MCH: 34.4 pg — ABNORMAL HIGH (ref 26.0–34.0)
MCHC: 33.8 g/dL (ref 30.0–36.0)
MCV: 101.9 fL — ABNORMAL HIGH (ref 80.0–100.0)
Monocytes Absolute: 2.6 10*3/uL — ABNORMAL HIGH (ref 0.1–1.0)
Monocytes Relative: 10 %
NEUTROS ABS: 19.9 10*3/uL — AB (ref 1.7–7.7)
NEUTROS PCT: 81 %
NRBC: 0 % (ref 0.0–0.2)
Platelets: 211 10*3/uL (ref 150–400)
RBC: 3.78 MIL/uL — ABNORMAL LOW (ref 3.87–5.11)
RDW: 13.1 % (ref 11.5–15.5)
WBC: 24.9 10*3/uL — ABNORMAL HIGH (ref 4.0–10.5)

## 2018-11-08 LAB — GLUCOSE, CAPILLARY
GLUCOSE-CAPILLARY: 160 mg/dL — AB (ref 70–99)
Glucose-Capillary: 221 mg/dL — ABNORMAL HIGH (ref 70–99)

## 2018-11-08 MED ORDER — SODIUM CHLORIDE 0.9% FLUSH
3.0000 mL | Freq: Two times a day (BID) | INTRAVENOUS | Status: DC
  Administered 2018-11-09 – 2018-11-12 (×6): 3 mL via INTRAVENOUS

## 2018-11-08 MED ORDER — INSULIN GLARGINE 100 UNIT/ML ~~LOC~~ SOLN
9.0000 [IU] | Freq: Every day | SUBCUTANEOUS | Status: DC
  Administered 2018-11-08: 9 [IU] via SUBCUTANEOUS
  Filled 2018-11-08 (×2): qty 0.09

## 2018-11-08 MED ORDER — ACETAMINOPHEN 325 MG PO TABS
650.0000 mg | ORAL_TABLET | Freq: Four times a day (QID) | ORAL | Status: DC | PRN
  Administered 2018-11-09: 650 mg via ORAL
  Filled 2018-11-08: qty 2

## 2018-11-08 MED ORDER — INSULIN ASPART 100 UNIT/ML ~~LOC~~ SOLN
0.0000 [IU] | Freq: Three times a day (TID) | SUBCUTANEOUS | Status: DC
  Administered 2018-11-09 – 2018-11-10 (×2): 3 [IU] via SUBCUTANEOUS
  Administered 2018-11-11: 1 [IU] via SUBCUTANEOUS
  Administered 2018-11-11: 5 [IU] via SUBCUTANEOUS
  Administered 2018-11-11: 1 [IU] via SUBCUTANEOUS
  Administered 2018-11-12 (×2): 2 [IU] via SUBCUTANEOUS
  Filled 2018-11-08 (×7): qty 1

## 2018-11-08 MED ORDER — DOCUSATE SODIUM 100 MG PO CAPS
200.0000 mg | ORAL_CAPSULE | Freq: Every day | ORAL | Status: DC
  Administered 2018-11-08 – 2018-11-09 (×2): 200 mg via ORAL
  Filled 2018-11-08 (×2): qty 2

## 2018-11-08 MED ORDER — SODIUM CHLORIDE 0.9% FLUSH: 3.0000 mL | INTRAVENOUS | Status: DC | PRN

## 2018-11-08 MED ORDER — ONDANSETRON HCL 4 MG PO TABS: 4.0000 mg | ORAL_TABLET | Freq: Four times a day (QID) | ORAL | Status: DC | PRN

## 2018-11-08 MED ORDER — PRAVASTATIN SODIUM 20 MG PO TABS
80.0000 mg | ORAL_TABLET | Freq: Every day | ORAL | Status: DC
  Administered 2018-11-09 – 2018-11-12 (×3): 80 mg via ORAL
  Filled 2018-11-08 (×3): qty 4

## 2018-11-08 MED ORDER — KETOROLAC TROMETHAMINE 15 MG/ML IJ SOLN
15.0000 mg | Freq: Four times a day (QID) | INTRAMUSCULAR | Status: DC | PRN
  Administered 2018-11-08 – 2018-11-10 (×4): 15 mg via INTRAVENOUS
  Filled 2018-11-08 (×4): qty 1

## 2018-11-08 MED ORDER — IBUPROFEN 400 MG PO TABS: 400.0000 mg | ORAL_TABLET | Freq: Four times a day (QID) | ORAL | Status: DC | PRN

## 2018-11-08 MED ORDER — LOSARTAN POTASSIUM 25 MG PO TABS
25.0000 mg | ORAL_TABLET | Freq: Every day | ORAL | Status: DC
  Administered 2018-11-09 – 2018-11-12 (×3): 25 mg via ORAL
  Filled 2018-11-08 (×3): qty 1

## 2018-11-08 MED ORDER — DOCUSATE SODIUM 100 MG PO CAPS
100.0000 mg | ORAL_CAPSULE | Freq: Every morning | ORAL | Status: DC
Start: 2018-11-09 — End: 2018-11-09

## 2018-11-08 MED ORDER — CEFTRIAXONE SODIUM 1 G IJ SOLR
1.0000 g | Freq: Once | INTRAMUSCULAR | Status: AC
  Administered 2018-11-08: 1 g via INTRAVENOUS
  Filled 2018-11-08: qty 10

## 2018-11-08 MED ORDER — ONDANSETRON HCL 4 MG/2ML IJ SOLN
4.0000 mg | Freq: Four times a day (QID) | INTRAMUSCULAR | Status: DC | PRN
  Administered 2018-11-12: 4 mg via INTRAVENOUS
  Filled 2018-11-08: qty 2

## 2018-11-08 MED ORDER — LORATADINE 10 MG PO TABS
10.0000 mg | ORAL_TABLET | Freq: Every day | ORAL | Status: DC | PRN
  Administered 2018-11-10: 10 mg via ORAL
  Filled 2018-11-08: qty 1

## 2018-11-08 MED ORDER — TERBINAFINE HCL 1 % EX CREA
1.0000 "application " | TOPICAL_CREAM | Freq: Two times a day (BID) | CUTANEOUS | Status: DC
  Administered 2018-11-08 – 2018-11-11 (×3): 1 via TOPICAL
  Filled 2018-11-08: qty 12

## 2018-11-08 MED ORDER — ENOXAPARIN SODIUM 40 MG/0.4ML ~~LOC~~ SOLN
40.0000 mg | SUBCUTANEOUS | Status: DC
  Administered 2018-11-08: 40 mg via SUBCUTANEOUS
  Filled 2018-11-08: qty 0.4

## 2018-11-08 MED ORDER — SODIUM CHLORIDE 0.9 % IV SOLN
250.0000 mL | INTRAVENOUS | Status: DC | PRN
  Administered 2018-11-09 (×2): 250 mL via INTRAVENOUS

## 2018-11-08 MED ORDER — SODIUM CHLORIDE 0.9 % IV BOLUS
1000.0000 mL | Freq: Once | INTRAVENOUS | Status: AC
  Administered 2018-11-08: 1000 mL via INTRAVENOUS

## 2018-11-08 MED ORDER — VANCOMYCIN HCL 500 MG IV SOLR
500.0000 mg | INTRAVENOUS | Status: DC
  Administered 2018-11-09 – 2018-11-11 (×3): 500 mg via INTRAVENOUS
  Filled 2018-11-08 (×5): qty 500

## 2018-11-08 MED ORDER — SODIUM CHLORIDE 0.9 % IV SOLN
3.0000 g | Freq: Four times a day (QID) | INTRAVENOUS | Status: DC
  Administered 2018-11-09 (×2): 3 g via INTRAVENOUS
  Filled 2018-11-08 (×5): qty 3

## 2018-11-08 MED ORDER — GLIMEPIRIDE 2 MG PO TABS
2.0000 mg | ORAL_TABLET | Freq: Every day | ORAL | Status: DC
  Filled 2018-11-08: qty 1

## 2018-11-08 MED ORDER — LORAZEPAM 2 MG PO TABS: 2.0000 mg | ORAL_TABLET | Freq: Every day | ORAL | Status: DC | PRN

## 2018-11-08 MED ORDER — TRAZODONE HCL 50 MG PO TABS
50.0000 mg | ORAL_TABLET | Freq: Every day | ORAL | Status: DC
  Administered 2018-11-08 – 2018-11-11 (×3): 50 mg via ORAL
  Filled 2018-11-08 (×3): qty 1

## 2018-11-08 MED ORDER — ACETAMINOPHEN 650 MG RE SUPP: 650.0000 mg | Freq: Four times a day (QID) | RECTAL | Status: DC | PRN

## 2018-11-08 MED ORDER — CITALOPRAM HYDROBROMIDE 20 MG PO TABS
10.0000 mg | ORAL_TABLET | Freq: Every day | ORAL | Status: DC
  Administered 2018-11-09 – 2018-11-12 (×3): 10 mg via ORAL
  Filled 2018-11-08 (×3): qty 1

## 2018-11-08 MED ORDER — VANCOMYCIN HCL 500 MG IV SOLR
500.0000 mg | Freq: Once | INTRAVENOUS | Status: AC
  Administered 2018-11-08: 500 mg via INTRAVENOUS
  Filled 2018-11-08: qty 500

## 2018-11-08 NOTE — ED Notes (Signed)
Glucose 221 in triage

## 2018-11-08 NOTE — ED Notes (Signed)
ED TO INPATIENT HANDOFF REPORT  ED Nurse Name and Phone #: Edson Deridder [phone]   S Name/Age/Gender Joann Mcconnell 48 y.o. female Room/Bed: ED18A/ED18A  Code Status   Code Status: Not on file  Home[facility]SNF/Other [facility] Patient oriented to: self Is this baseline? Yes   Triage Complete: Triage complete  Chief Complaint Foot Swelling  Triage Note PT here with caret[facility]. Pt has swelling, redness and abscess noted to RT foot. PT hx of diabetes. PT ambulates at baseline but will not ambulate on affected foot since yesterday. Caretaker states glucose in the 300s since yesterday. NAD noted. VSS   Allergies No Known Allergies  Level of Care/Admitting Diagnosis ED Disposition    ED Disposition Condition Comment   Admit  Hospital Area: [facility] [[id number]]  Level of Care: Med-Surg [16]  Diagnosis: Cellulitis [[id number]]  Admitting Physician: Auburn Bilberry [[id number]]  Attending Physician: Auburn Bilberry [[id number]]  Estimated length of stay: past midnight tomorrow  Certification:: I certify this patient will need inpatient services for at least 2 midnights  PT Class (Do Not Modify): Inpatient [101]  PT Acc Code (Do Not Modify): Private [1]       B Medical/Surgery History Past Medical History:  Diagnosis Date  . Anxiety   . Diabetes mellitus without complication (HCC)   . Hyperlipemia   . Microcephaly (HCC)   . MR (mental retardation)   . Seizure disorder [facility])    Past Surgical History:  Procedure Laterality Date  . NO PAST SURGERIES       A IV Location/Drains/Wounds Patient Lines/Drains/Airways Status   Active Line/Drains/Airways    Name:   Placement date:   Placement time:   Site:   Days:   Peripheral IV 11/08/18 Right Antecubital   11/08/18    1600    Antecubital   less than 1          Intake/Output Last 24 hours No intake or output data in the 24 hours ending 11/08/18 1803  Labs/Imaging Results for  orders placed or performed during the hospital encounter of 11/08/18 (from the past 48 hour(s))  Glucose, capillary     Status: Abnormal   Collection Time: 11/08/18  3:11 PM  Result Value Ref Range   Glucose-Capillary 221 (H) 70 - 99 mg/dL   Comment 1 Notify RN   Basic metabolic panel     Status: Abnormal   Collection Time: 11/08/18  3:57 PM  Result Value Ref Range   Sodium 140 135 - 145 mmol/L   Potassium 3.6 3.5 - 5.1 mmol/L    Comment: HEMOLYSIS AT THIS LEVEL MAY AFFECT RESULT   Chloride 101 98 - 111 mmol/L   CO2 29 22 - 32 mmol/L   Glucose, Bld 216 (H) 70 - 99 mg/dL   BUN 17 6 - 20 mg/dL   Creatinine, Ser 2.81 0.44 - 1.00 mg/dL   Calcium 9.3 8.9 - 18.8 mg/dL   GFR calc non Af Amer >60 >60 mL/min   GFR calc Af Amer >60 >60 mL/min   Anion gap 10 5 - 15    Comment: Performed at [facility], [address] [place], [place] [address]  CBC with Differential     Status: Abnormal   Collection Time: 11/08/18  3:57 PM  Result Value Ref Range   WBC 24.9 (H) 4.0 - 10.5 K/uL   RBC 3.78 (L) 3.87 - 5.11 MIL/uL   Hemoglobin 13.0 12.0 - 15.0 g/dL  HCT 38.5 36.0 - 46.0 %   MCV 101.9 (H) 80.0 - 100.0 fL   MCH 34.4 (H) 26.0 - 34.0 pg   MCHC 33.8 30.0 - 36.0 g/dL   RDW 73.2 20.2 - 54.2 %   Platelets 211 150 - 400 K/uL   nRBC 0.0 0.0 - 0.2 %   Neutrophils Relative % 81 %   Neutro Abs 19.9 (H) 1.7 - 7.7 K/uL   Lymphocytes Relative 9 %   Lymphs Abs 2.1 0.7 - 4.0 K/uL   Monocytes Relative 10 %   Monocytes Absolute 2.6 (H) 0.1 - 1.0 K/uL   Eosinophils Relative 0 %   Eosinophils Absolute 0.1 0.0 - 0.5 K/uL   Basophils Relative 0 %   Basophils Absolute 0.0 0.0 - 0.1 K/uL   Immature Granulocytes 0 %   Abs Immature Granulocytes 0.10 (H) 0.00 - 0.07 K/uL    Comment: Performed at [facility], [address] [place], [place] [address]   Dg Foot 2 Views Right  Result Date: 11/08/2018 CLINICAL DATA:  abscess bottom of right foot(near distal 1st MIP jt). Entire right foot  red and hot to touch Pt has cerebral palsy EXAM: RIGHT FOOT - 2 VIEW COMPARISON:  None. FINDINGS: Osseous alignment is normal. Bone mineralization is normal. No fracture line or displaced fracture fragment seen. No destructive changes, asymmetric demineralization or other signs of osteomyelitis. Presumed soft tissue swelling/edema. No soft tissue gas appreciated. IMPRESSION: 1. No osseous abnormality. No osseous fracture or dislocation. No evidence of osteomyelitis. 2. Presumed soft tissue swelling/edema.  No soft tissue gas seen. Electronically Signed   By: Bary Richard M.D.   On: 11/08/2018 15:57    Pending Labs Unresulted Labs (From admission, onward)    Start     Ordered   11/08/18 1759  Aerobic Culture (superficial specimen)  Once,   STAT     11/08/18 1758   Signed and Held  CBC  (enoxaparin (LOVENOX)    CrCl >/= 30 ml/min)  Once,   R    Comments:  Baseline for enoxaparin therapy IF NOT ALREADY DRAWN.  Notify MD if PLT < 100 K.    Signed and Held   Signed and Held  Creatinine, serum  (enoxaparin (LOVENOX)    CrCl >/= 30 ml/min)  Once,   R    Comments:  Baseline for enoxaparin therapy IF NOT ALREADY DRAWN.    Signed and Held   Signed and Held  Creatinine, serum  (enoxaparin (LOVENOX)    CrCl >/= 30 ml/min)  Weekly,   R    Comments:  while on enoxaparin therapy    Signed and Held          Vitals/Pain Today's Vitals   11/08/18 1508 11/08/18 1646  BP: (!) 162/100   Pulse: (!) 114 97  Resp: 16   Temp: 98.3 F (36.8 C)   TempSrc: Axillary   SpO2: 98% 100%    Isolation Precautions No active isolations  Medications Medications  cefTRIAXone (ROCEPHIN) 1 g in sodium chloride 0.9 % 100 mL IVPB (1 g Intravenous New Bag/Given 11/08/18 1743)  insulin aspart (novoLOG) injection 0-9 Units (has no administration in time range)  sodium chloride 0.9 % bolus 1,000 mL (0 mLs Intravenous Stopped 11/08/18 1731)  vancomycin (VANCOCIN) 500 mg in sodium chloride 0.9 % 100 mL IVPB (0 mg  Intravenous Stopped 11/08/18 1729)    Mobility manual wheelchair Low fall risk   Focused Assessments cellulitis/abscess   R Recommendations: See Admitting  Provider Note  Report given to:   Additional Notes:

## 2018-11-08 NOTE — Consult Note (Signed)
Reason for Consult: Cellulitis with abscess right foot. Referring Physician: Megyn Grimes is an 48 y.o. female.  HPI: This is a 48 year old diabetic female nonverbal who lives in a group home with recent development of some redness and swelling in her right foot.  Inability to bear weight for the last day or 2.  Presented to the emergency department and was admitted for cellulitis with abscess and significantly elevated white count.  Past Medical History:  Diagnosis Date  . Anxiety   . Diabetes mellitus without complication (HCC)   . Hyperlipemia   . Microcephaly (HCC)   . MR (mental retardation)   . Seizure disorder [facility])     Past Surgical History:  Procedure Laterality Date  . NO PAST SURGERIES      Family History  Family history unknown: Yes    Social History:  reports that she has never smoked. She has never used smokeless tobacco. She reports that she does not drink alcohol or use drugs.  Allergies: No Known Allergies  Medications:  Scheduled: . [START ON 11/09/2018] insulin aspart  0-9 Units Subcutaneous TID WC    Results for orders placed or performed during the hospital encounter of 11/08/18 (from the past 48 hour(s))  Glucose, capillary     Status: Abnormal   Collection Time: 11/08/18  3:11 PM  Result Value Ref Range   Glucose-Capillary 221 (H) 70 - 99 mg/dL   Comment 1 Notify RN   Basic metabolic panel     Status: Abnormal   Collection Time: 11/08/18  3:57 PM  Result Value Ref Range   Sodium 140 135 - 145 mmol/L   Potassium 3.6 3.5 - 5.1 mmol/L    Comment: HEMOLYSIS AT THIS LEVEL MAY AFFECT RESULT   Chloride 101 98 - 111 mmol/L   CO2 29 22 - 32 mmol/L   Glucose, Bld 216 (H) 70 - 99 mg/dL   BUN 17 6 - 20 mg/dL   Creatinine, Ser 7.41 0.44 - 1.00 mg/dL   Calcium 9.3 8.9 - 42.3 mg/dL   GFR calc non Af Amer >60 >60 mL/min   GFR calc Af Amer >60 >60 mL/min   Anion gap 10 5 - 15    Comment: Performed at [facility], [address]  [place], [place] [address]  CBC with Differential     Status: Abnormal   Collection Time: 11/08/18  3:57 PM  Result Value Ref Range   WBC 24.9 (H) 4.0 - 10.5 K/uL   RBC 3.78 (L) 3.87 - 5.11 MIL/uL   Hemoglobin 13.0 12.0 - 15.0 g/dL   HCT 23.3 43.5 - 68.6 %   MCV 101.9 (H) 80.0 - 100.0 fL   MCH 34.4 (H) 26.0 - 34.0 pg   MCHC 33.8 30.0 - 36.0 g/dL   RDW 16.8 37.2 - 90.2 %   Platelets 211 150 - 400 K/uL   nRBC 0.0 0.0 - 0.2 %   Neutrophils Relative % 81 %   Neutro Abs 19.9 (H) 1.7 - 7.7 K/uL   Lymphocytes Relative 9 %   Lymphs Abs 2.1 0.7 - 4.0 K/uL   Monocytes Relative 10 %   Monocytes Absolute 2.6 (H) 0.1 - 1.0 K/uL   Eosinophils Relative 0 %   Eosinophils Absolute 0.1 0.0 - 0.5 K/uL   Basophils Relative 0 %   Basophils Absolute 0.0 0.0 - 0.1 K/uL   Immature Granulocytes 0 %   Abs Immature Granulocytes 0.10 (H) 0.00 - 0.07 K/uL  Comment: Performed at [facility], [address] [place], [place] [address]    Dg Foot 2 Views Right  Result Date: 11/08/2018 CLINICAL DATA:  abscess bottom of right foot(near distal 1st MIP jt). Entire right foot red and hot to touch Pt has cerebral palsy EXAM: RIGHT FOOT - 2 VIEW COMPARISON:  None. FINDINGS: Osseous alignment is normal. Bone mineralization is normal. No fracture line or displaced fracture fragment seen. No destructive changes, asymmetric demineralization or other signs of osteomyelitis. Presumed soft tissue swelling/edema. No soft tissue gas appreciated. IMPRESSION: 1. No osseous abnormality. No osseous fracture or dislocation. No evidence of osteomyelitis. 2. Presumed soft tissue swelling/edema.  No soft tissue gas seen. Electronically Signed   By: Bary Richard M.D.   On: 11/08/2018 15:57    Review of Systems  Unable to perform ROS: Patient nonverbal   Blood pressure (!) 162/100, pulse 86, temperature 98.3 F (36.8 C), temperature source Axillary, resp. rate 16, height 4\' 9"  (1.448 m), weight 34.5 kg, last menstrual period  08/19/2013, SpO2 100 %. Physical Exam  Cardiovascular:  DP and PT pulses are palpable bilateral.  Musculoskeletal:     Comments: Adequate range of motion in the pedal joints with guarding on the right.  Some contracture noted.  Muscle testing is deferred.  Neurological:  Unable to fully assess but patient appears to be intact as far sensation and does exhibit a significant pain response in the right foot.  Skin:  The skin is warm dry and supple.  There is some significant erythema and edema in the right forefoot.  A large intact blister is present on the plantar aspect of the right foot pre-debridement.  Post debridement there is a large granular superficial ulceration approximately 4 cm x 3 cm with irregular borders.  No clear deep extension or expressible abscess.    Assessment/Plan: Assessment: 1.  Cellulitis with abscess right foot. 2.  Superficial ulceration right forefoot. 3.  Diabetes.  Plan: Debrided devitalized tissue from the abscessed and ulcerative area on the right forefoot excisionally and sharply using tissue nippers including just superficial tissue.  A culture was taken for sensitivities from the deeper aspect.  A saline wet-to-dry gauze dressing was applied to the wound on the right foot.  At this point we will see how she responds to the debridement and antibiotics and reassess her wound tomorrow prior to deciding on any type of further imaging studies such as an MRI.  Follow-up tomorrow.  Ricci Barker 11/08/2018, 8:39 PM

## 2018-11-08 NOTE — ED Notes (Signed)
Report given and pt ready to go up

## 2018-11-08 NOTE — ED Triage Notes (Signed)
PT here with caretaker from [facility]. Pt has swelling, redness and abscess noted to RT foot. PT hx of diabetes. PT ambulates at baseline but will not ambulate on affected foot since yesterday. Caretaker states glucose in the 300s since yesterday. NAD noted. VSS

## 2018-11-08 NOTE — Progress Notes (Signed)
Pharmacy Antibiotic Note  Joann Mcconnell is a 48 y.o. female admitted on 11/08/2018 with cellulitis.  Pharmacy has been consulted for Vancomycin, Unasyn dosing.  Plan: Ceftriaxone 1 gm IV X 1 given in ED on 3/22 @ 1800.  Unasyn 3 gm IV Q6H ordered to start on 3/23 @ 0000.   Vancomycin 500 mg IV X 1 given in ED on 3/22 @ 1620. Vancomycin 500 mg IV Q24H ordered to start on 3/23 @ 1600. No peak and trough currently ordered.  Vd = 24.8 L Ke = 0.04 hr-1 T1/2 = 16.9 hrs  AUC = 490.7 Cmax = 32.1 Cmin = 12.3   Height: 4\' 9"  (144.8 cm) Weight: 76 lb (34.5 kg) IBW/kg (Calculated) : 38.6  Temp (24hrs), Avg:98.3 F (36.8 C), Min:98.3 F (36.8 C), Max:98.3 F (36.8 C)  Recent Labs  Lab 11/08/18 1557  WBC 24.9*  CREATININE 0.84    Estimated Creatinine Clearance: 44.1 mL/min (by C-G formula based on SCr of 0.84 mg/dL).    No Known Allergies  Antimicrobials this admission:   >>    >>   Dose adjustments this admission:   Microbiology results:  BCx:   UCx:    Sputum:    MRSA PCR:   Thank you for allowing pharmacy to be a part of this patient's care.  Tenzin Pavon D 11/08/2018 7:38 PM

## 2018-11-08 NOTE — H&P (Signed)
[facility]   PATIENT NAME: Joann Mcconnell    MR#:  [id number]  DATE OF BIRTH:  11-14-1970  DATE OF ADMISSION:  11/08/2018  PRIMARY CARE PHYSICIAN: Lauro Regulus, MD   REQUESTING/REFERRING PHYSICIAN: Sharman Cheek, MD  CHIEF COMPLAINT:   Chief Complaint  Patient presents with  . Foot Pain    HISTORY OF PRESENT ILLNESS: Joann Mcconnell  is a 48 y.o. female with a known history of diabetic neuropathy and intellectual disability who was brought to the hospital with right foot swelling.  He also has drainage from the bottom aspect of her foot of clear liquid.  Patient unable to provide any review of systems her care provider from the group home is at the bedside.  She states that patient is wheelchair-bound however she does get up to go to the bathroom.     PAST MEDICAL HISTORY:   Past Medical History:  Diagnosis Date  . Anxiety   . Diabetes mellitus without complication (HCC)   . Hyperlipemia   . Microcephaly (HCC)   . MR (mental retardation)   . Seizure disorder (HCC)     PAST SURGICAL HISTORY:  Past Surgical History:  Procedure Laterality Date  . NO PAST SURGERIES      SOCIAL HISTORY:  Social History   Tobacco Use  . Smoking status: Never Smoker  . Smokeless tobacco: Never Used  Substance Use Topics  . Alcohol use: No    FAMILY HISTORY:  Family History  Family history unknown: Yes    DRUG ALLERGIES: No Known Allergies  REVIEW OF SYSTEMS:   CONSTITUTIONAL: Unable to provide  MEDICATIONS AT HOME:  Prior to Admission medications   Medication Sig Start Date End Date Taking? Authorizing Provider  Blood Glucose Monitoring Suppl (GLUCOCOM BLOOD GLUCOSE MONITOR) DEVI 1 each by XX route as directed. 07/06/14   [provider]  cetirizine (ZYRTEC) 10 MG tablet Take 10 mg by mouth daily.    [provider]  citalopram (CELEXA) 10 MG tablet Take 10 mg by mouth daily.    [provider]   docusate sodium (COLACE) 100 MG capsule Take 100 mg by mouth 2 (two) times daily.    [provider]  feeding supplement, GLUCERNA SHAKE, (GLUCERNA SHAKE) LIQD DRINK 120 ML BY MOUTH 3 TIMES DAILY WITHMEALS AND AT 2PM FOR SNACK. MAY HAVE OTHER SNACK CHOICES 1/2 CHEESE OR MEAT SANDWICH AT 2PM. 10/11/15   [provider]  glimepiride (AMARYL) 4 MG tablet Take 4 mg by mouth daily with breakfast.    [provider]  glucose blood (BAYER CONTOUR TEST) test strip Use once daily. To check blood sugar  Dx code: E11.9 01/16/16   [provider]  Insulin Glargine (LANTUS SOLOSTAR) 100 UNIT/ML Solostar Pen INJECT 9 UNITS SQ ONCE DAILY AT BEDTIME 09/29/15   [provider]  Insulin Pen Needle (EASY TOUCH PEN NEEDLES) 31G X 6 MM MISC USE WITH LANTUS DAILY 06/05/16   [provider]  LORazepam (ATIVAN) 1 MG tablet Take 1 mg by mouth every 8 (eight) hours.    [provider]  losartan (COZAAR) 25 MG tablet TAKE 1 TABLET BY MOUTH ONCE DAILY FOR BP 02/02/16   [provider]  pravastatin (PRAVACHOL) 80 MG tablet Take 80 mg by mouth daily.    [provider]  tolnaftate (TINACTIN) 1 % powder Apply 1 application topically 2 (two) times daily.    [provider]  traZODone (DESYREL) 50 MG  tablet Take 50 mg by mouth at bedtime.    [provider]      PHYSICAL EXAMINATION:   VITAL SIGNS: Blood pressure (!) 162/100, pulse (!) 114, temperature 98.3 F (36.8 C), temperature source Axillary, resp. rate 16, last menstrual period 08/19/2013, SpO2 98 %.  GENERAL:  48 y.o.-year-old patient lying in the bed with no acute distress.  EYES: Pupils equal, round, reactive to light and accommodation. No scleral icterus. Extraocular muscles intact.  HEENT: Head atraumatic, normocephalic. Oropharynx and nasopharynx clear.  NECK:  Supple, no jugular venous distention. No thyroid enlargement, no tenderness.  LUNGS: Normal breath sounds  bilaterally, no wheezing, rales,rhonchi or crepitation. No use of accessory muscles of respiration.  CARDIOVASCULAR: S1, S2 normal. No murmurs, rubs, or gallops.  ABDOMEN: Soft, nontender, nondistended. Bowel sounds present. No organomegaly or mass.  EXTREMITIES: Right lower extremity swelling with drainage NEUROLOGIC: Cranial nerves II through XII are intact. Muscle strength 5/5 in all extremities. Sensation intact. Gait not checked.  PSYCHIATRIC: The patient is alert and oriented x 3.  SKIN: Right lower extremity swelling left foot with erythema and drainage from bottom aspect of her foot LABORATORY PANEL:   CBC Recent Labs  Lab 11/08/18 1557  WBC 24.9*  HGB 13.0  HCT 38.5  PLT 211  MCV 101.9*  MCH 34.4*  MCHC 33.8  RDW 13.1  LYMPHSABS PENDING  MONOABS PENDING  EOSABS PENDING  BASOSABS PENDING   ------------------------------------------------------------------------------------------------------------------  Chemistries  Recent Labs  Lab 11/08/18 1557  NA 140  K 3.6  CL 101  CO2 29  GLUCOSE 216*  BUN 17  CREATININE 0.84  CALCIUM 9.3   ------------------------------------------------------------------------------------------------------------------ CrCl cannot be calculated (Unknown ideal weight.). ------------------------------------------------------------------------------------------------------------------ No results for input(s): TSH, T4TOTAL, T3FREE, THYROIDAB in the last 72 hours.  Invalid input(s): FREET3   Coagulation profile No results for input(s): INR, PROTIME in the last 168 hours. ------------------------------------------------------------------------------------------------------------------- No results for input(s): DDIMER in the last 72 hours. -------------------------------------------------------------------------------------------------------------------  Cardiac Enzymes No results for input(s): CKMB, TROPONINI, MYOGLOBIN in the last 168  hours.  Invalid input(s): CK ------------------------------------------------------------------------------------------------------------------ Invalid input(s): POCBNP  ---------------------------------------------------------------------------------------------------------------  Urinalysis No results found for: COLORURINE, APPEARANCEUR, LABSPEC, PHURINE, GLUCOSEU, HGBUR, BILIRUBINUR, KETONESUR, PROTEINUR, UROBILINOGEN, NITRITE, LEUKOCYTESUR   RADIOLOGY: Dg Foot 2 Views Right  Result Date: 11/08/2018 CLINICAL DATA:  abscess bottom of right foot(near distal 1st MIP jt). Entire right foot red and hot to touch Pt has cerebral palsy EXAM: RIGHT FOOT - 2 VIEW COMPARISON:  None. FINDINGS: Osseous alignment is normal. Bone mineralization is normal. No fracture line or displaced fracture fragment seen. No destructive changes, asymmetric demineralization or other signs of osteomyelitis. Presumed soft tissue swelling/edema. No soft tissue gas appreciated. IMPRESSION: 1. No osseous abnormality. No osseous fracture or dislocation. No evidence of osteomyelitis. 2. Presumed soft tissue swelling/edema.  No soft tissue gas seen. Electronically Signed   By: Bary Richard M.D.   On: 11/08/2018 15:57    EKG: No orders found for this or any previous visit.  IMPRESSION AND PLAN: Patient is 48 year old with intellectual disability presenting with diabetic foot infection  1.  Diabetic foot infection associated with cellulitis and abscess I will have podiatry see the patient Culture of the wound Treat with IV vancomycin and Unasyn Will likely need surgery  2.  Diabetes type 2 we will place on sliding scale insulin continue her on her home insulin with Lantus and Amaryl   3.  Hypertension continue Cozaar  4.  Hyperlipidemia continue Pravachol  5.  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  Miscellaneous Lovenox for DVT prophylaxis    All the  records are reviewed and case discussed with ED provider. Management plans discussed with the patient, family and they are in agreement.  CODE STATUS: Full code    TOTAL TIME TAKING CARE OF THIS PATIENT:78minutes.    Auburn Bilberry M.D on 11/08/2018 at 5:21 PM  Between 7am to 6pm - Pager - [phone]  After 6pm go to [email or web address] - password [email or web address]  [facility] Office  [phone]  CC: Primary care physician; Lauro Regulus, MD

## 2018-11-08 NOTE — ED Provider Notes (Signed)
[facility] Emergency Department Provider Note  ____________________________________________  Time seen: Approximately 5:19 PM  I have reviewed the triage vital signs and the nursing notes.   HISTORY  Chief Complaint Foot Pain    Level 5 Caveat: Portions of the History and Physical including HPI and review of systems are unable to be completely obtained due to patient being a poor historian   Additional history obtained from caregiver at bedside.  HPI Joann Mcconnell is a 48 y.o. female with a history of diabetes microcephaly and intellectual disability who is brought to the ED due to swelling and redness of the right foot, first noticed yesterday, worsening today.  They deny fever.  The area is obviously tender to the touch, but they are not aware of any other symptoms.  Normal oral intake.  Patient is wheelchair-bound at baseline.  Patient unable to relate any specific complaints.      Past Medical History:  Diagnosis Date  . Anxiety   . Diabetes mellitus without complication (HCC)   . Hyperlipemia   . Microcephaly (HCC)   . MR (mental retardation)   . Seizure disorder [facility])      Patient Active Problem List   Diagnosis Date Noted  . Menopause 07/08/2017  . Microcephaly (HCC) 07/08/2017  . HTN (hypertension), benign 01/09/2014  . Hyperlipidemia, unspecified 01/09/2014  . Legally blind 01/09/2014  . Intellectual disability 01/09/2014  . Seizure disorder (HCC) 01/09/2014  . 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 NO PAST SURGERIES       Prior to Admission medications   Medication Sig Start Date End Date Taking? Authorizing Provider  Blood Glucose Monitoring Suppl (GLUCOCOM BLOOD GLUCOSE MONITOR) DEVI 1 each by XX route as directed. 07/06/14   [provider]  cetirizine (ZYRTEC) 10 MG tablet Take 10 mg by mouth daily.    [provider]  citalopram (CELEXA) 10 MG tablet Take 10 mg by mouth daily.    [provider]  docusate sodium (COLACE) 100 MG capsule Take 100 mg by mouth 2 (two) times daily.    [provider]  feeding supplement, GLUCERNA SHAKE, (GLUCERNA SHAKE) LIQD DRINK 120 ML BY MOUTH 3 TIMES DAILY WITHMEALS AND AT 2PM FOR SNACK. MAY HAVE OTHER SNACK CHOICES 1/2 CHEESE OR MEAT SANDWICH AT 2PM. 10/11/15   [provider]  glimepiride (AMARYL) 4 MG tablet Take 4 mg by mouth daily with breakfast.    [provider]  glucose blood (BAYER CONTOUR TEST) test strip Use once daily. To check blood sugar  Dx code: E11.9 01/16/16   [provider]  Insulin Glargine (LANTUS SOLOSTAR) 100 UNIT/ML Solostar Pen INJECT 9 UNITS SQ ONCE DAILY AT BEDTIME 09/29/15   [provider]  Insulin Pen Needle (EASY TOUCH PEN NEEDLES) 31G X 6 MM MISC USE WITH LANTUS DAILY 06/05/16   [provider]  LORazepam (ATIVAN) 1 MG tablet Take 1 mg by mouth every 8 (eight) hours.    [provider]  losartan (COZAAR) 25 MG tablet TAKE 1 TABLET BY MOUTH ONCE DAILY FOR BP 02/02/16   [provider]  pravastatin (PRAVACHOL) 80 MG tablet Take 80 mg by mouth daily.    [provider]  tolnaftate (TINACTIN) 1 % powder Apply 1 application topically 2 (two) times daily.    [provider]  traZODone (DESYREL) 50 MG tablet Take 50  mg by mouth at bedtime.    [provider]     Allergies Patient has no known allergies.   Family History  Family history unknown: Yes    Social History Social History   Tobacco Use  . Smoking status: Never Smoker  . Smokeless tobacco: Never Used  Substance Use Topics  . Alcohol use: No  . Drug use: No    Review of Systems Level 5 Caveat: Portions of the History and Physical including HPI and review of systems are unable to be completely obtained due to patient being a poor historian   Constitutional:    No known fever.  ENT:   No rhinorrhea. Cardiovascular:   No  syncope. Respiratory: No cough. Gastrointestinal:   Negative vomiting and diarrhea.  Musculoskeletal:   Positive right foot pain and swelling ____________________________________________   PHYSICAL EXAM:  VITAL SIGNS: ED Triage Vitals [11/08/18 1508]  Enc Vitals Group     BP (!) 162/100     Pulse Rate (!) 114     Resp 16     Temp 98.3 F (36.8 C)     Temp Source Axillary     SpO2 98 %     Weight      Height      Head Circumference      Peak Flow      Pain Score      Pain Loc      Pain Edu?      Excl. in GC?     Vital signs reviewed, nursing assessments reviewed.   Constitutional: Awake and alert, not oriented. Non-toxic appearance. Eyes:   Conjunctivae are normal. EOMI.  ENT      Head:   Normocephalic and atraumatic.      Nose:   No congestion/rhinnorhea.       Mouth/Throat:   MMM, no pharyngeal erythema. No peritonsillar mass.       Neck:   No meningismus. Full ROM. Hematological/Lymphatic/Immunilogical:   No cervical lymphadenopathy. Cardiovascular:   Tachycardia heart rate 110. Symmetric bilateral radial and DP pulses.  No murmurs. Cap refill less than 2 seconds. Respiratory:   Normal respiratory effort without tachypnea/retractions. Breath sounds are clear and equal bilaterally. No wheezes/rales/rhonchi. Gastrointestinal:   Soft and nontender. Non distended. There is no CVA tenderness.  No rebound, rigidity, or guarding. Musculoskeletal:   Normal range of motion in all extremities. No joint effusions.  There is diffuse swelling of the right foot, worse at the distal foot with erythema warmth and tenderness.  Blanches normally.  There is a 2 to 3 cm fluctuant area, blister versus abscess, on the plantar surface proximal to the second through fourth toes.  There is erythematous streaking to the proximal foot and up to the distal fibula area. Neurologic:   Nonverbal at baseline.  Motor grossly intact. No  acute focal neurologic deficits are appreciated.  Skin:    Skin is warm, dry and intact.  Inflammatory changes of the right foot consistent with cellulitis as above.  ____________________________________________    LABS (pertinent positives/negatives) (all labs ordered are listed, but only abnormal results are displayed) Labs Reviewed  GLUCOSE, CAPILLARY - Abnormal; Notable for the following components:      Result Value   Glucose-Capillary 221 (*)    All other components within normal limits  BASIC METABOLIC PANEL - Abnormal; Notable for the following components:   Glucose, Bld 216 (*)    All other components within normal limits  CBC WITH DIFFERENTIAL/PLATELET - Abnormal;  Notable for the following components:   WBC 24.9 (*)    RBC 3.78 (*)    MCV 101.9 (*)    MCH 34.4 (*)    All other components within normal limits   ____________________________________________   EKG    ____________________________________________    RADIOLOGY  Dg Foot 2 Views Right  Result Date: 11/08/2018 CLINICAL DATA:  abscess bottom of right foot(near distal 1st MIP jt). Entire right foot red and hot to touch Pt has cerebral palsy EXAM: RIGHT FOOT - 2 VIEW COMPARISON:  None. FINDINGS: Osseous alignment is normal. Bone mineralization is normal. No fracture line or displaced fracture fragment seen. No destructive changes, asymmetric demineralization or other signs of osteomyelitis. Presumed soft tissue swelling/edema. No soft tissue gas appreciated. IMPRESSION: 1. No osseous abnormality. No osseous fracture or dislocation. No evidence of osteomyelitis. 2. Presumed soft tissue swelling/edema.  No soft tissue gas seen. Electronically Signed   By: Bary Richard M.D.   On: 11/08/2018 15:57    ____________________________________________   PROCEDURES .Marland KitchenIncision and Drainage Date/Time: 11/08/2018 5:24 PM Performed by: Sharman Cheek, MD Authorized by: Sharman Cheek, MD   Consent:    Consent  obtained:  Verbal   Consent given by: Caregiver at bedside.   Risks discussed:  Bleeding, infection, incomplete drainage and pain   Alternatives discussed:  Alternative treatment, delayed treatment and observation Location:    Type:  Abscess   Size:  3 cm   Location:  Lower extremity   Lower extremity location:  Foot   Foot location:  R foot Pre-procedure details:    Skin preparation:  Betadine Anesthesia (see MAR for exact dosages):    Anesthesia method:  None Procedure type:    Complexity:  Simple Procedure details:    Needle aspiration: no     Incision types:  Stab incision   Incision depth:  Dermal   Scalpel blade:  11   Drainage:  Serous   Drainage amount:  Moderate   Packing materials:  None Post-procedure details:    Patient tolerance of procedure:  Tolerated well, no immediate complications Comments:     Padded with clean gauze, wrapped with Kerlix for protection.    ____________________________________________    CLINICAL IMPRESSION / ASSESSMENT AND PLAN / ED COURSE  Pertinent labs & imaging results that were available during my care of the patient were reviewed by me and considered in my medical decision making (see chart for details).    Patient presents with redness swelling and tenderness of the right foot, concerning for cellulitis in the setting of longstanding disability and diabetes as complicating factors.  Vital signs unremarkable except for tachycardia.  Not septic on initial assessment.  X-ray obtained which is negative for foreign body, negative for subcutaneous gas or osseous reaction.  Labs show white blood cell count of 25,000, otherwise unremarkable.  Vancomycin and ceftriaxone ordered as well as IV fluid bolus.  Clinical Course as of Nov 08 1723  Sun Nov 08, 2018  1656 Fluctuant area incised, drained clear serous fluid c/w blister. Not purulent   [PS]    Clinical Course User Index [PS] Sharman Cheek, MD     ----------------------------------------- 5:25 PM on 11/08/2018 -----------------------------------------  D/w hospitalist for further management   ____________________________________________   FINAL CLINICAL IMPRESSION(S) / ED DIAGNOSES    Final diagnoses:  Cellulitis of left foot     ED Discharge Orders    None      Portions of this note were generated with dragon dictation software.  Dictation errors may occur despite best attempts at proofreading.   Sharman Cheek, MD 11/08/18 1725

## 2018-11-08 NOTE — ED Notes (Signed)
Spoke with floor to remind them pt will need a sitter.

## 2018-11-08 NOTE — ED Notes (Signed)
Pt is sitting in her wheelchair with her aide by her. Per the aide the pt does not like to lay down until bedtime, so iv was started while pt was in her w/c. Pt was cooperative with insertion with the aide's help and comfort to the pt.

## 2018-11-09 LAB — CBC WITH DIFFERENTIAL/PLATELET
Abs Immature Granulocytes: 0.08 10*3/uL — ABNORMAL HIGH (ref 0.00–0.07)
Basophils Absolute: 0.1 10*3/uL (ref 0.0–0.1)
Basophils Relative: 0 %
Eosinophils Absolute: 0 10*3/uL (ref 0.0–0.5)
Eosinophils Relative: 0 %
HCT: 40.3 % (ref 36.0–46.0)
Hemoglobin: 13.4 g/dL (ref 12.0–15.0)
Immature Granulocytes: 1 %
Lymphocytes Relative: 15 %
Lymphs Abs: 2.2 10*3/uL (ref 0.7–4.0)
MCH: 35.1 pg — ABNORMAL HIGH (ref 26.0–34.0)
MCHC: 33.3 g/dL (ref 30.0–36.0)
MCV: 105.5 fL — ABNORMAL HIGH (ref 80.0–100.0)
Monocytes Absolute: 1.1 10*3/uL — ABNORMAL HIGH (ref 0.1–1.0)
Monocytes Relative: 7 %
Neutro Abs: 11.7 10*3/uL — ABNORMAL HIGH (ref 1.7–7.7)
Neutrophils Relative %: 77 %
Platelets: 200 10*3/uL (ref 150–400)
RBC: 3.82 MIL/uL — ABNORMAL LOW (ref 3.87–5.11)
RDW: 13.4 % (ref 11.5–15.5)
WBC: 15.1 10*3/uL — AB (ref 4.0–10.5)
nRBC: 0 % (ref 0.0–0.2)

## 2018-11-09 LAB — MRSA PCR SCREENING: MRSA by PCR: NEGATIVE

## 2018-11-09 LAB — GLUCOSE, CAPILLARY
GLUCOSE-CAPILLARY: 174 mg/dL — AB (ref 70–99)
Glucose-Capillary: 62 mg/dL — ABNORMAL LOW (ref 70–99)
Glucose-Capillary: 221 mg/dL — ABNORMAL HIGH (ref 70–99)
Glucose-Capillary: 243 mg/dL — ABNORMAL HIGH (ref 70–99)
Glucose-Capillary: 212 mg/dL — ABNORMAL HIGH (ref 70–99)

## 2018-11-09 MED ORDER — DEXTROSE 50 % IV SOLN
25.0000 mL | Freq: Once | INTRAVENOUS | Status: AC
  Administered 2018-11-10: 25 mL via INTRAVENOUS
  Filled 2018-11-09 (×2): qty 50

## 2018-11-09 MED ORDER — NEPRO/CARBSTEADY PO LIQD
237.0000 mL | Freq: Two times a day (BID) | ORAL | Status: DC
  Administered 2018-11-09 – 2018-11-12 (×4): 237 mL via ORAL

## 2018-11-09 MED ORDER — INSULIN GLARGINE 100 UNIT/ML ~~LOC~~ SOLN
6.0000 [IU] | Freq: Every day | SUBCUTANEOUS | Status: DC
  Administered 2018-11-09: 6 [IU] via SUBCUTANEOUS
  Filled 2018-11-09 (×4): qty 0.06

## 2018-11-09 MED ORDER — OCUVITE-LUTEIN PO CAPS
1.0000 | ORAL_CAPSULE | Freq: Every day | ORAL | Status: DC
  Administered 2018-11-11 – 2018-11-12 (×2): 1 via ORAL
  Filled 2018-11-09 (×3): qty 1

## 2018-11-09 MED ORDER — CHLORHEXIDINE GLUCONATE 0.12 % MT SOLN
15.0000 mL | Freq: Two times a day (BID) | OROMUCOSAL | Status: DC
  Administered 2018-11-11 – 2018-11-12 (×3): 15 mL via OROMUCOSAL
  Filled 2018-11-09 (×3): qty 15

## 2018-11-09 MED ORDER — ORAL CARE MOUTH RINSE
15.0000 mL | Freq: Two times a day (BID) | OROMUCOSAL | Status: DC
  Administered 2018-11-11 (×2): 15 mL via OROMUCOSAL

## 2018-11-09 MED ORDER — VITAMIN C 500 MG PO TABS
250.0000 mg | ORAL_TABLET | Freq: Two times a day (BID) | ORAL | Status: DC
  Administered 2018-11-09 – 2018-11-12 (×4): 250 mg via ORAL
  Filled 2018-11-09 (×4): qty 1

## 2018-11-09 MED ORDER — DOCUSATE SODIUM 100 MG PO CAPS
100.0000 mg | ORAL_CAPSULE | Freq: Every morning | ORAL | Status: DC
  Filled 2018-11-09: qty 1

## 2018-11-09 MED ORDER — PIPERACILLIN-TAZOBACTAM 3.375 G IVPB
3.3750 g | Freq: Three times a day (TID) | INTRAVENOUS | Status: DC
  Administered 2018-11-09 – 2018-11-11 (×6): 3.375 g via INTRAVENOUS
  Filled 2018-11-09 (×6): qty 50

## 2018-11-09 MED ORDER — ENOXAPARIN SODIUM 30 MG/0.3ML ~~LOC~~ SOLN
30.0000 mg | SUBCUTANEOUS | Status: DC
  Administered 2018-11-09: 30 mg via SUBCUTANEOUS
  Filled 2018-11-09: qty 0.3

## 2018-11-09 NOTE — Evaluation (Signed)
Clinical/Bedside Swallow Evaluation Patient Details  Name: Joann Mcconnell MRN: [id number] Date of Birth: Jun 08, 1971  Today's Date: 11/09/2018 Time: SLP Start Time (ACUTE ONLY): 0930 SLP Stop Time (ACUTE ONLY): 1030 SLP Time Calculation (min) (ACUTE ONLY): 60 min  Past Medical History:  Past Medical History:  Diagnosis Date  . Anxiety   . Diabetes mellitus without complication (HCC)   . Hyperlipemia   . Microcephaly (HCC)   . MR (mental retardation)   . Seizure disorder [facility]    Past Surgical History:  Past Surgical History:  Procedure Laterality Date  . NO PAST SURGERIES     HPI:  Pt is a 48 y.o. female with a known history of diabetic neuropathy, seizure, microcephaly, anxiety, and intellectual disability (MR) who was brought to the hospital from [facility] with right foot swelling.  There is drainage from the bottom aspect of her foot of clear liquid.  Patient unable to provide any review of systems her care provider from the Group Home is at the bedside; she states that patient is wheelchair-bound.  Per family report, pt eats "mashed" foods and drinks Ensure.  Pt needs assistance w/ ADLs.    Assessment / Plan / Recommendation Clinical Impression  Pt appears to present w/ min+ oral phase dysphagia most impacted by her decreased overall awareness during self-feeding tasks and need for increased oral phase time for bolus manipulation especially w/ boluses of increased textured/foods. This can increase risk for dysphagia, aspiration. Suspect her presentation is directly related to her baseline declined Cognitive status; MR. Report by family is pt eats more "mashed" foods and drinks Ensure. Ensuring food was pureed, alternating b/t foods/liquids, and giving time b/t EACH bite/sip during eval, she was able to consume several trials of the puree foods and thin liquids VIA CUP w/ no overt s/s of aspiration noted. She consumed trials of thin liquids VIA CUP holding cup to feed  herself w/ no overt s/s of aspiration noted; no decline in respiratory effort during/pos trials. Suspect pt's declined Cognitive status is impairing her overall awareness during eating/drinking and impacting her oral phase bolus preparation for A-P transfer. No OM weakness was grossly apparent; pt seemed to want to try to feed herself the few sips of liquids but needed support and guidance - doing the drinking herself seemed to improve the swallowing/intake. Suggest modifying environment and provide upright positioning to allow pt to engage in the self-feeding tasks of drinking - this will reduce risk for aspiration. Recommend a Pureed diet consistency; Thin liquids; general aspiration precautions; Pills Crushed in puree for safer, efficient swallowing. Supervision to support pt during meals. Pt does appear to be at her baseline re: swallowing and eating/drinking as per family report to [name].  SLP Visit Diagnosis: Dysphagia, oral phase (R13.11)(impacted by declined Cognitive status, MR)    Aspiration Risk  Mild aspiration risk    Diet Recommendation  Dysphagia level 1 (PUREE) w/ Thin liquids; general aspiration precautions; Support at meals w/ feeding - pt can hold cup when drinking  Medication Administration: Crushed with puree    Other  Recommendations Recommended Consults: (Dietician f/u) Oral Care Recommendations: Oral care BID;Staff/trained caregiver to provide oral care Other Recommendations: (n/a)   Follow up Recommendations None      Frequency and Duration (n/a)  (n/a)       Prognosis Prognosis for Safe Diet Advancement: Fair Barriers to Reach Goals: Cognitive deficits;Time post onset;Severity of deficits      Swallow Study   General Date  of Onset: 11/08/18 HPI: Pt is a 48 y.o. female with a known history of diabetic neuropathy, seizure, microcephaly, anxiety, and intellectual disability (MR) who was brought to the hospital from [facility] with right foot swelling.   There is drainage from the bottom aspect of her foot of clear liquid.  Patient unable to provide any review of systems her care provider from the Group Home is at the bedside; she states that patient is wheelchair-bound.  Per family report, pt eats "mashed" foods and drinks Ensure.  Pt needs assistance w/ ADLs.  Type of Study: Bedside Swallow Evaluation Previous Swallow Assessment: unsure but none reported in chart Diet Prior to this Study: NPO Temperature Spikes Noted: No(wbc elevated but declining) Respiratory Status: Room air History of Recent Intubation: No Behavior/Cognition: Cooperative;Pleasant mood;Confused;Distractible;Requires cueing(Awake, nonverbal; fidgity w/ her hands) Oral Cavity Assessment: (could not assess dt/ Cognitive status) Oral Care Completed by SLP: Recent completion by staff Oral Cavity - Dentition: (unsure) Vision: Functional for self-feeding(she feed herself the drinks) Self-Feeding Abilities: Able to feed self;Needs assist;Needs set up;Total assist Patient Positioning: Upright in bed;Postural control adequate for testing(needed positioning w/ pillows d/t legs contracted) Baseline Vocal Quality: (nonverbal) Volitional Cough: Cognitively unable to elicit Volitional Swallow: Unable to elicit    Oral/Motor/Sensory Function Overall Oral Motor/Sensory Function: Generalized oral weakness(unable to do a formal OM exam; WFL w/ liquids) Facial ROM: Within Functional Limits Facial Symmetry: Within Functional Limits Lingual Symmetry: Within Functional Limits   Ice Chips Ice chips: Not tested   Thin Liquid Thin Liquid: Impaired(but grossly WFL) Presentation: Cup;Self Fed(~6-7 ozs total ) Oral Phase Impairments: Poor awareness of bolus(impulsive intermittently) Oral Phase Functional Implications: Prolonged oral transit Pharyngeal  Phase Impairments: (none but audible swallows) Other Comments: Ensure, water    Nectar Thick Nectar Thick Liquid: Not tested   Honey Thick  Honey Thick Liquid: Not tested   Puree Puree: Impaired Presentation: Spoon(fed; 8 trials) Oral Phase Impairments: Reduced lingual movement/coordination;Poor awareness of bolus(min) Oral Phase Functional Implications: Prolonged oral transit;Oral residue(min) Pharyngeal Phase Impairments: (none) Other Comments: any diffuse oral residue cleared w/ alternating foods/liquids   Solid     Solid: Not tested       Jerilynn Som, MS, CCC-SLP Watson,Katherine 11/09/2018,1:07 PM

## 2018-11-09 NOTE — Progress Notes (Signed)
Subjective/Chief Complaint: Patient seen.  Still nonverbal but nurse states that seems to be having a significant pain response still with her right foot.   Objective: Vital signs in last 24 hours: Temp:  [98 F (36.7 C)-98.5 F (36.9 C)] 98 F (36.7 C) (03/23 0536) Pulse Rate:  [85-114] 85 (03/23 0536) Resp:  [16-20] 18 (03/23 0536) BP: (111-162)/(65-100) 111/65 (03/23 0536) SpO2:  [90 %-100 %] 91 % (03/23 0536) Weight:  [34.5 kg] 34.5 kg (03/22 1846)    Intake/Output from previous day: 03/22 0701 - 03/23 0700 In: 131.7 [I.V.:31.7; IV Piggyback:100] Out: -  Intake/Output this shift: Total I/O In: 240 [P.O.:240] Out: -   Some moderate drainage with mild bleeding and purulence on the bandaging.  Upon removal appears to be mostly granular with a central area of some fibrotic tissue which may represent deeper ulceration.  Still some continued erythema and edema in the foot.  Lab Results:  Recent Labs    11/08/18 1557 11/09/18 0929  WBC 24.9* 15.1*  HGB 13.0 13.4  HCT 38.5 40.3  PLT 211 200   BMET Recent Labs    11/08/18 1557  NA 140  K 3.6  CL 101  CO2 29  GLUCOSE 216*  BUN 17  CREATININE 0.84  CALCIUM 9.3   PT/INR No results for input(s): LABPROT, INR in the last 72 hours. ABG No results for input(s): PHART, HCO3 in the last 72 hours.  Invalid input(s): PCO2, PO2  Studies/Results: Dg Foot 2 Views Right  Result Date: 11/08/2018 CLINICAL DATA:  abscess bottom of right foot(near distal 1st MIP jt). Entire right foot red and hot to touch Pt has cerebral palsy EXAM: RIGHT FOOT - 2 VIEW COMPARISON:  None. FINDINGS: Osseous alignment is normal. Bone mineralization is normal. No fracture line or displaced fracture fragment seen. No destructive changes, asymmetric demineralization or other signs of osteomyelitis. Presumed soft tissue swelling/edema. No soft tissue gas appreciated. IMPRESSION: 1. No osseous abnormality. No osseous fracture or dislocation. No  evidence of osteomyelitis. 2. Presumed soft tissue swelling/edema.  No soft tissue gas seen. Electronically Signed   By: Bary Richard M.D.   On: 11/08/2018 15:57    Anti-infectives: Anti-infectives (From admission, onward)   Start     Dose/Rate Route Frequency Ordered Stop   11/09/18 1600  vancomycin (VANCOCIN) 500 mg in sodium chloride 0.9 % 100 mL IVPB     500 mg 100 mL/hr over 60 Minutes Intravenous Every 24 hours 11/08/18 1933     11/09/18 1300  piperacillin-tazobactam (ZOSYN) IVPB 3.375 g     3.375 g 12.5 mL/hr over 240 Minutes Intravenous Every 8 hours 11/09/18 1239     11/09/18 0000  Ampicillin-Sulbactam (UNASYN) 3 g in sodium chloride 0.9 % 100 mL IVPB  Status:  Discontinued     3 g 200 mL/hr over 30 Minutes Intravenous Every 6 hours 11/08/18 1923 11/09/18 1239   11/08/18 1700  cefTRIAXone (ROCEPHIN) 1 g in sodium chloride 0.9 % 100 mL IVPB     1 g 200 mL/hr over 30 Minutes Intravenous  Once 11/08/18 1658 11/08/18 1828   11/08/18 1530  vancomycin (VANCOCIN) 500 mg in sodium chloride 0.9 % 100 mL IVPB     500 mg 100 mL/hr over 60 Minutes Intravenous  Once 11/08/18 1522 11/08/18 1729      Assessment/Plan: s/p * No surgery found * Assessment: Cellulitis with abscess right foot.  Ulceration right foot.   Plan: Sterile saline wet-to-dry gauze reapplied to the right  foot.  Spoke with Dr. Elpidio Anis about the possibility of an MRI which in her condition would probably be very difficult to obtain.  We will give it another day to see how she responds as far as her blood work and dressing change.  May require an attempt at MRI as she very likely could need some type of debridement and less this significantly improves.  Follow-up with her tomorrow.  LOS: 1 day    Ricci Barker 11/09/2018

## 2018-11-09 NOTE — Progress Notes (Addendum)
[employer] - [place] [facility]   PATIENT NAME: Mariah Glassberg    MR#:  [id number]  DATE OF BIRTH:  [birth year]  SUBJECTIVE:  CHIEF COMPLAINT:   Chief Complaint  Patient presents with  . Foot Pain   Nonverbal.  Not following instructions.  Afebrile  REVIEW OF SYSTEMS:    Review of Systems  Unable to perform ROS: Mental acuity    DRUG ALLERGIES:  No Known Allergies  VITALS:  Blood pressure 111/65, pulse 85, temperature 98 F (36.7 C), temperature source Oral, resp. rate 18, height 4\' 9"  (1.448 m), weight 34.5 kg, last menstrual period 08/19/2013, SpO2 91 %.  PHYSICAL EXAMINATION:   Physical Exam  GENERAL:  48 y.o.-year-old patient lying in the bed with no acute distress.  EYES: Pupils equal, round, reactive to light and accommodation. No scleral icterus. Extraocular muscles intact.  HEENT: Head atraumatic, normocephalic. Oropharynx and nasopharynx clear.  NECK:  Supple, no jugular venous distention. No thyroid enlargement, no tenderness.  LUNGS: Normal breath sounds bilaterally, no wheezing, rales, rhonchi. No use of accessory muscles of respiration.  CARDIOVASCULAR: S1, S2 normal. No murmurs, rubs, or gallops.  ABDOMEN: Soft, nontender, nondistended. Bowel sounds present. No organomegaly or mass.  EXTREMITIES: No cyanosis, clubbing or edema b/l.    Right foot dressing NEUROLOGIC: Not following instructions. PSYCHIATRIC: The patient is alert and awake SKIN: No obvious rash, lesion, or ulcer.   LABORATORY PANEL:   CBC Recent Labs  Lab 11/09/18 0929  WBC 15.1*  HGB 13.4  HCT 40.3  PLT 200   ------------------------------------------------------------------------------------------------------------------ Chemistries  Recent Labs  Lab 11/08/18 1557  NA 140  K 3.6  CL 101  CO2 29  GLUCOSE 216*  BUN 17  CREATININE 0.84  CALCIUM 9.3    ------------------------------------------------------------------------------------------------------------------  Cardiac Enzymes No results for input(s): TROPONINI in the last 168 hours. ------------------------------------------------------------------------------------------------------------------  RADIOLOGY:  Dg Foot 2 Views Right  Result Date: 11/08/2018 CLINICAL DATA:  abscess bottom of right foot(near distal 1st MIP jt). Entire right foot red and hot to touch Pt has cerebral palsy EXAM: RIGHT FOOT - 2 VIEW COMPARISON:  None. FINDINGS: Osseous alignment is normal. Bone mineralization is normal. No fracture line or displaced fracture fragment seen. No destructive changes, asymmetric demineralization or other signs of osteomyelitis. Presumed soft tissue swelling/edema. No soft tissue gas appreciated. IMPRESSION: 1. No osseous abnormality. No osseous fracture or dislocation. No evidence of osteomyelitis. 2. Presumed soft tissue swelling/edema.  No soft tissue gas seen. Electronically Signed   By: Bary Richard M.D.   On: 11/08/2018 15:57     ASSESSMENT AND PLAN:   Patient is 48 year old with intellectual disability presenting with diabetic foot infection  *Hypoglycemia with diabetes mellitus.  Patient refusing to drink orange juice.  Not eating.  Will give stat dose of D50.  Stopped Amaryl.  Decreased dose of Levemir tonight. Continue Accu-Cheks Will need to start D5 drip if no improvement.  *  Diabetic left foot infection associated with cellulitis and abscess Podiatry consulted and status post debridement Wound culture pending  IV vancomycin and Unasyn Continue dressing changes.  *  Hypertension continue Cozaar  *  Hyperlipidemia continue Pravachol  *  Behavioral issues related to her intellectual disability continue her Celexa trazodone  *  Miscellaneous Lovenox for DVT prophylaxis  All the records are reviewed and case discussed with Care Management/Social  Workerr. Management plans discussed with the patient, family and they are in agreement.  CODE STATUS: FULL CODE  DVT Prophylaxis: SCDs  TOTAL CRITICAL CARE TIME TAKING CARE OF THIS PATIENT: 35 minutes.   POSSIBLE D/C IN 2-3 DAYS, DEPENDING ON CLINICAL CONDITION.  Molinda Bailiff Linford Quintela M.D on 11/09/2018 at 10:23 AM  Between 7am to 6pm - Pager - [phone]  After 6pm go to [email or web address] - password [email or web address] [facility]  [facility]  Office  [phone]  CC: Primary care physician; Lauro Regulus, MD  Note: This dictation was prepared with Dragon dictation along with smaller phrase technology. Any transcriptional errors that result from this process are unintentional.

## 2018-11-09 NOTE — Progress Notes (Signed)
PHARMACIST - PHYSICIAN COMMUNICATION  CONCERNING:  Enoxaparin (Lovenox) for DVT Prophylaxis    RECOMMENDATION: Patient was prescribed enoxaprin 40mg  q24 hours for VTE prophylaxis.   Filed Weights   11/08/18 1846  Weight: 76 lb (34.5 kg)    Body mass index is 16.45 kg/m.  Estimated Creatinine Clearance: 44.1 mL/min (by C-G formula based on SCr of 0.84 mg/dL).   Patient is candidate for enoxaparin 30mg  every 24 hours based on  Weight less then 45kg for female  DESCRIPTION: Pharmacy has adjusted enoxaparin dose..  Patient is now receiving enoxaparin 30mg  every 24 hours.  Gardner Candle, PharmD, BCPS Clinical Pharmacist 11/09/2018 1:58 AM

## 2018-11-10 ENCOUNTER — Inpatient Hospital Stay: Payer: Medicare Other

## 2018-11-10 ENCOUNTER — Inpatient Hospital Stay: Payer: Medicare Other | Admitting: Anesthesiology

## 2018-11-10 ENCOUNTER — Encounter: Payer: Self-pay | Admitting: Anesthesiology

## 2018-11-10 ENCOUNTER — Encounter
Admission: EM | Disposition: A | Discharge status: Nursing Home (Includes ICF) | Payer: Self-pay | Source: Home / Self Care | Attending: Internal Medicine

## 2018-11-10 HISTORY — PX: IRRIGATION AND DEBRIDEMENT FOOT: SHX6602

## 2018-11-10 LAB — CBC WITH DIFFERENTIAL/PLATELET
Abs Immature Granulocytes: 0.07 10*3/uL (ref 0.00–0.07)
Basophils Absolute: 0.1 10*3/uL (ref 0.0–0.1)
Basophils Relative: 1 %
Eosinophils Absolute: 0 10*3/uL (ref 0.0–0.5)
Eosinophils Relative: 0 %
HEMATOCRIT: 38.2 % (ref 36.0–46.0)
HEMOGLOBIN: 12.7 g/dL (ref 12.0–15.0)
Immature Granulocytes: 1 %
LYMPHS ABS: 1.6 10*3/uL (ref 0.7–4.0)
LYMPHS PCT: 14 %
MCH: 34.1 pg — ABNORMAL HIGH (ref 26.0–34.0)
MCHC: 33.2 g/dL (ref 30.0–36.0)
MCV: 102.7 fL — AB (ref 80.0–100.0)
MONOS PCT: 7 %
Monocytes Absolute: 0.8 10*3/uL (ref 0.1–1.0)
Neutro Abs: 8.8 10*3/uL — ABNORMAL HIGH (ref 1.7–7.7)
Neutrophils Relative %: 77 %
Platelets: 213 10*3/uL (ref 150–400)
RBC: 3.72 MIL/uL — ABNORMAL LOW (ref 3.87–5.11)
RDW: 13.1 % (ref 11.5–15.5)
WBC: 11.4 10*3/uL — ABNORMAL HIGH (ref 4.0–10.5)
nRBC: 0 % (ref 0.0–0.2)

## 2018-11-10 LAB — GLUCOSE, CAPILLARY
GLUCOSE-CAPILLARY: 127 mg/dL — AB (ref 70–99)
GLUCOSE-CAPILLARY: 343 mg/dL — AB (ref 70–99)
GLUCOSE-CAPILLARY: 76 mg/dL (ref 70–99)
Glucose-Capillary: 240 mg/dL — ABNORMAL HIGH (ref 70–99)
Glucose-Capillary: 41 mg/dL — CL (ref 70–99)
Glucose-Capillary: 68 mg/dL — ABNORMAL LOW (ref 70–99)
Glucose-Capillary: 162 mg/dL — ABNORMAL HIGH (ref 70–99)
Glucose-Capillary: 151 mg/dL — ABNORMAL HIGH (ref 70–99)

## 2018-11-10 LAB — BASIC METABOLIC PANEL
Anion gap: 9 (ref 5–15)
BUN: 12 mg/dL (ref 6–20)
CO2: 28 mmol/L (ref 22–32)
Calcium: 9.2 mg/dL (ref 8.9–10.3)
Chloride: 106 mmol/L (ref 98–111)
Creatinine, Ser: 0.76 mg/dL (ref 0.44–1.00)
GFR calc Af Amer: >60 mL/min (ref >60)
GFR calc non Af Amer: >60 mL/min (ref >60)
Glucose, Bld: 202 mg/dL — ABNORMAL HIGH (ref 70–99)
POTASSIUM: 3.2 mmol/L — AB (ref 3.5–5.1)
Sodium: 143 mmol/L (ref 135–145)

## 2018-11-10 LAB — CREATININE, SERUM
Creatinine, Ser: 0.77 mg/dL (ref 0.44–1.00)
GFR calc Af Amer: >60 mL/min (ref >60)
GFR calc non Af Amer: >60 mL/min (ref >60)

## 2018-11-10 SURGERY — IRRIGATION AND DEBRIDEMENT FOOT
Anesthesia: General | Laterality: Right

## 2018-11-10 MED ORDER — GLYCOPYRROLATE 0.2 MG/ML IJ SOLN
INTRAMUSCULAR | Status: DC | PRN
  Administered 2018-11-10: 0.2 mg via INTRAVENOUS

## 2018-11-10 MED ORDER — OXYCODONE-ACETAMINOPHEN 5-325 MG PO TABS
1.0000 | ORAL_TABLET | Freq: Four times a day (QID) | ORAL | Status: DC | PRN
  Administered 2018-11-10 (×2): 1 via ORAL
  Filled 2018-11-10 (×2): qty 1

## 2018-11-10 MED ORDER — BUPIVACAINE HCL (PF) 0.5 % IJ SOLN
INTRAMUSCULAR | Status: DC | PRN
  Administered 2018-11-10: 10 mL

## 2018-11-10 MED ORDER — DEXTROSE 50 % IV SOLN
25.0000 mL | Freq: Once | INTRAVENOUS | Status: AC
  Administered 2018-11-10: 25 mL via INTRAVENOUS

## 2018-11-10 MED ORDER — ONDANSETRON HCL 4 MG/2ML IJ SOLN
INTRAMUSCULAR | Status: DC | PRN
  Administered 2018-11-10: 4 mg via INTRAVENOUS

## 2018-11-10 MED ORDER — OXYCODONE-ACETAMINOPHEN 5-325 MG PO TABS: 1.0000 | ORAL_TABLET | ORAL | Status: DC | PRN

## 2018-11-10 MED ORDER — ONDANSETRON HCL 4 MG/2ML IJ SOLN: 4.0000 mg | Freq: Once | INTRAMUSCULAR | Status: DC | PRN

## 2018-11-10 MED ORDER — LIDOCAINE HCL (PF) 2 % IJ SOLN
INTRAMUSCULAR | Status: AC
  Filled 2018-11-10: qty 10

## 2018-11-10 MED ORDER — FENTANYL CITRATE (PF) 100 MCG/2ML IJ SOLN: 25.0000 ug | INTRAMUSCULAR | Status: DC | PRN

## 2018-11-10 MED ORDER — FENTANYL CITRATE (PF) 100 MCG/2ML IJ SOLN
INTRAMUSCULAR | Status: AC
  Filled 2018-11-10: qty 2

## 2018-11-10 MED ORDER — FENTANYL CITRATE (PF) 100 MCG/2ML IJ SOLN
INTRAMUSCULAR | Status: DC | PRN
  Administered 2018-11-10 (×2): 25 ug via INTRAVENOUS

## 2018-11-10 MED ORDER — LORAZEPAM 2 MG/ML IJ SOLN
1.0000 mg | Freq: Once | INTRAMUSCULAR | Status: AC
  Administered 2018-11-10: 1 mg via INTRAVENOUS
  Filled 2018-11-10: qty 1

## 2018-11-10 MED ORDER — MIDAZOLAM HCL 2 MG/2ML IJ SOLN
INTRAMUSCULAR | Status: AC
  Filled 2018-11-10: qty 2

## 2018-11-10 MED ORDER — ENOXAPARIN SODIUM 40 MG/0.4ML ~~LOC~~ SOLN: 40.0000 mg | SUBCUTANEOUS | Status: DC

## 2018-11-10 MED ORDER — SODIUM CHLORIDE 0.9 % IV SOLN
INTRAVENOUS | Status: DC | PRN
  Administered 2018-11-10: 17:00:00 via INTRAVENOUS

## 2018-11-10 MED ORDER — MIDAZOLAM HCL 2 MG/2ML IJ SOLN
INTRAMUSCULAR | Status: DC | PRN
  Administered 2018-11-10: 1 mg via INTRAVENOUS

## 2018-11-10 MED ORDER — PROPOFOL 10 MG/ML IV BOLUS
INTRAVENOUS | Status: AC
  Filled 2018-11-10: qty 20

## 2018-11-10 MED ORDER — LIDOCAINE HCL (CARDIAC) PF 100 MG/5ML IV SOSY
PREFILLED_SYRINGE | INTRAVENOUS | Status: DC | PRN
  Administered 2018-11-10: 40 mg via INTRAVENOUS

## 2018-11-10 MED ORDER — DEXTROSE 50 % IV SOLN
INTRAVENOUS | Status: AC
  Administered 2018-11-10: 25 mL via INTRAVENOUS
  Filled 2018-11-10: qty 50

## 2018-11-10 MED ORDER — DEXTROSE-NACL 5-0.9 % IV SOLN
INTRAVENOUS | Status: DC
  Administered 2018-11-10: 20:00:00 via INTRAVENOUS

## 2018-11-10 MED ORDER — PROPOFOL 10 MG/ML IV BOLUS
INTRAVENOUS | Status: DC | PRN
  Administered 2018-11-10: 70 mg via INTRAVENOUS
  Administered 2018-11-10: 30 mg via INTRAVENOUS

## 2018-11-10 MED ORDER — MORPHINE SULFATE (PF) 2 MG/ML IV SOLN
1.0000 mg | INTRAVENOUS | Status: DC | PRN
  Administered 2018-11-11 – 2018-11-12 (×6): 1 mg via INTRAVENOUS
  Filled 2018-11-10 (×6): qty 1

## 2018-11-10 SURGICAL SUPPLY — 46 items
BANDAGE ELASTIC 4 LF NS (GAUZE/BANDAGES/DRESSINGS) ×3 IMPLANT
BLADE OSCILLATING/SAGITTAL (BLADE)
BLADE SURG 15 STRL LF DISP TIS (BLADE) ×1 IMPLANT
BLADE SURG 15 STRL SS (BLADE) ×2
BLADE SW THK.38XMED LNG THN (BLADE) IMPLANT
BNDG CONFORM 2 STRL LF (GAUZE/BANDAGES/DRESSINGS) ×3 IMPLANT
BNDG ESMARK 4X12 TAN STRL LF (GAUZE/BANDAGES/DRESSINGS) ×3 IMPLANT
BNDG GAUZE 4.5X4.1 6PLY STRL (MISCELLANEOUS) ×3 IMPLANT
CANISTER SUCT 1200ML W/VALVE (MISCELLANEOUS) ×3 IMPLANT
COVER WAND RF STERILE (DRAPES) ×3 IMPLANT
CUFF TOURN 18 STER (MISCELLANEOUS) IMPLANT
CUFF TOURN DUAL PL 12 NO SLV (MISCELLANEOUS) ×3 IMPLANT
DRAPE FLUOR MINI C-ARM 54X84 (DRAPES) IMPLANT
DURAPREP 26ML APPLICATOR (WOUND CARE) ×3 IMPLANT
ELECT REM PT RETURN 9FT ADLT (ELECTROSURGICAL) ×3
ELECTRODE REM PT RTRN 9FT ADLT (ELECTROSURGICAL) ×1 IMPLANT
GAUZE PETRO XEROFOAM 1X8 (MISCELLANEOUS) ×3 IMPLANT
GAUZE SPONGE 4X4 12PLY STRL (GAUZE/BANDAGES/DRESSINGS) ×3 IMPLANT
GLOVE BIO SURGEON STRL SZ7.5 (GLOVE) ×3 IMPLANT
GLOVE INDICATOR 8.0 STRL GRN (GLOVE) ×3 IMPLANT
GOWN STRL REUS W/ TWL LRG LVL3 (GOWN DISPOSABLE) ×2 IMPLANT
GOWN STRL REUS W/TWL LRG LVL3 (GOWN DISPOSABLE) ×4
HANDPIECE VERSAJET DEBRIDEMENT (MISCELLANEOUS) ×3 IMPLANT
LABEL OR SOLS (LABEL) ×3 IMPLANT
NEEDLE FILTER BLUNT 18X 1/2SAF (NEEDLE) ×2
NEEDLE FILTER BLUNT 18X1 1/2 (NEEDLE) ×1 IMPLANT
NEEDLE HYPO 25X1 1.5 SAFETY (NEEDLE) ×9 IMPLANT
NS IRRIG 500ML POUR BTL (IV SOLUTION) ×3 IMPLANT
PACK EXTREMITY ARMC (MISCELLANEOUS) ×3 IMPLANT
PAD ABD DERMACEA PRESS 5X9 (GAUZE/BANDAGES/DRESSINGS) ×6 IMPLANT
PENCIL ELECTRO HAND CTR (MISCELLANEOUS) ×3 IMPLANT
RASP SM TEAR CROSS CUT (RASP) IMPLANT
SOL PREP PVP 2OZ (MISCELLANEOUS) ×3
SOLUTION PREP PVP 2OZ (MISCELLANEOUS) ×1 IMPLANT
STOCKINETTE STRL 4IN 9604848 (GAUZE/BANDAGES/DRESSINGS) ×3 IMPLANT
STOCKINETTE STRL 6IN 960660 (GAUZE/BANDAGES/DRESSINGS) IMPLANT
SUT ETHILON 3-0 FS-10 30 BLK (SUTURE) ×3
SUT ETHILON 4-0 (SUTURE) ×2
SUT ETHILON 4-0 FS2 18XMFL BLK (SUTURE) ×1
SUT VIC AB 3-0 SH 27 (SUTURE)
SUT VIC AB 3-0 SH 27X BRD (SUTURE) IMPLANT
SUT VIC AB 4-0 FS2 27 (SUTURE) IMPLANT
SUTURE EHLN 3-0 FS-10 30 BLK (SUTURE) ×1 IMPLANT
SUTURE ETHLN 4-0 FS2 18XMF BLK (SUTURE) ×1 IMPLANT
SYR 10ML LL (SYRINGE) ×6 IMPLANT
SYR 3ML LL SCALE MARK (SYRINGE) ×3 IMPLANT

## 2018-11-10 NOTE — Transfer of Care (Signed)
Immediate Anesthesia Transfer of Care Note  Patient: Joann Mcconnell  Procedure(s) Performed: IRRIGATION AND DEBRIDEMENT FOOT (Right )  Patient Location: PACU  Anesthesia Type:General  Level of Consciousness: sedated  Airway & Oxygen Therapy: Patient connected to face mask oxygen  Post-op Assessment: Post -op Vital signs reviewed and stable  Post vital signs: stable  Last Vitals:  Vitals Value Taken Time  BP 99/77 11/10/2018  6:23 PM  Temp 36.3 C 11/10/2018  6:22 PM  Pulse 105 11/10/2018  6:22 PM  Resp 14 11/10/2018  6:24 PM  SpO2 100 % 11/10/2018  6:22 PM  Vitals shown include unvalidated device data.  Last Pain:  Vitals:   11/10/18 1103  TempSrc: Oral  PainSc:          Complications: No apparent anesthesia complications

## 2018-11-10 NOTE — Anesthesia Post-op Follow-up Note (Signed)
Anesthesia QCDR form completed.        

## 2018-11-10 NOTE — Op Note (Signed)
Date of operation: 11/10/2018.  Surgeon: Ricci Barker D.P.M.  Preoperative diagnosis: Abscess right foot.  Postoperative diagnosis: Same.  Procedure: I&D abscess right foot.  Anesthesia: LMA with local.  Hemostasis: Pneumatic tourniquet right ankle 250 mmHg.  Estimated blood loss: Less than 5 cc.  Cultures: Deep wound cultures right foot.  Pathology: None.  Operative indications: This is a 48 year old female with recent development of cellulitis and abscess on her right foot.  Bedside I&D performed but there continues to be signs of deep infection and MRI was obtained which did show deeper abscess and decision was made for I&D of the right foot.  Operative procedure: Patient was taken to the operating room and placed on the table in the supine position.  Following satisfactory LMA anesthesia the right forefoot was anesthetized with 10 cc of 0.5% bupivacaine plain.  A pneumatic tourniquet was applied at the level of the right ankle and the foot was prepped and draped in the usual sterile fashion.  The foot was exsanguinated and the tourniquet inflated to 250 mmHg.     Attention was directed to the plantar aspect of the right foot where a large abscessed area with some central fibrotic areas of full-thickness ulceration were noted.  These nongranular areas were then sharply and excisionally debrided full-thickness down to the level of the deeper musculature using a pair of rongeurs.  An abscess was noted and a culture was taken for sensitivities.  Rough debridement of all of the gross devitalized tissue was again performed with the rongeurs and then the wound was thoroughly debrided and irrigated using a versa jet debrider on a setting of 2.  The wound was then flushed with copious amounts of sterile saline from a bulb syringe and the open wound was reapproximated using 3-0 nylon simple interrupted sutures.  Xeroform 4 x 4's and con form applied to the right foot and the tourniquet was released.   ABD Kerlix and Ace wrap followed by Covan were then applied over the foot and leg.  Patient was awakened and transported to the PACU with vital signs stable and in good condition.

## 2018-11-10 NOTE — Progress Notes (Signed)
Inpatient Diabetes Program Recommendations  AACE/ADA: New Consensus Statement on Inpatient Glycemic Control   Target Ranges:  Prepandial:   less than 140 mg/dL      Peak postprandial:   less than 180 mg/dL (1-2 hours)      Critically ill patients:  140 - 180 mg/dL  Results for MIKIRA, MCELMURRAY (MRN [id number]) as of 11/10/2018 10:46  Ref. Range 11/09/2018 07:23 11/09/2018 08:59 11/09/2018 11:33 11/09/2018 16:43 11/09/2018 21:04 11/10/2018 07:20  Glucose-Capillary Latest Ref Range: 70 - 99 mg/dL 62 (L) 972 (H) 820 (H) 243 (H) 212 (H) 127 (H)    Review of Glycemic Control  Diabetes history: DM2 Outpatient Diabetes medications: Lantus 9 units QHS, Amaryl 2 mg daily Current orders for Inpatient glycemic control: Lantus 6 units QHS, Novolog 0-9 units TID with meals  Inpatient Diabetes Program Recommendations:   Insulin - Basal: Noted fasting glucose 62 mg/dl on 01/18/55 and Lantus was decreased from 9 units to 6 units QHS. Fasting glucose 127 mg/dl today.  Insulin - Meal Coverage: Patient is currently NPO and may need procedure this afternoon.  Once diet is resumed, please consider ordering Novolog 2 units TID with meals for meal coverage if patient eats at least 50% of meals.  Thanks, Orlando Penner, RN, MSN, CDE Diabetes Coordinator Inpatient Diabetes Program [phone] (Team Pager from 8am to 5pm)

## 2018-11-10 NOTE — Plan of Care (Signed)
  Problem: Education: Goal: Knowledge of General Education information will improve Description Including pain rating scale, medication(s)/side effects and non-pharmacologic comfort measures Outcome: Progressing   Problem: Health Behavior/Discharge Planning: Goal: Ability to manage health-related needs will improve Outcome: Progressing   Problem: Clinical Measurements: Goal: Ability to maintain clinical measurements within normal limits will improve Outcome: Progressing Goal: Will remain free from infection Outcome: Progressing Goal: Diagnostic test results will improve Outcome: Progressing Goal: Respiratory complications will improve Outcome: Progressing Goal: Cardiovascular complication will be avoided Outcome: Progressing   Problem: Activity: Goal: Risk for activity intolerance will decrease Outcome: Progressing   Problem: Nutrition: Goal: Adequate nutrition will be maintained Outcome: Progressing  Patient is a feeder and ate 100% of her breakfast, now NPO for surgery.

## 2018-11-10 NOTE — Progress Notes (Signed)
Initial Nutrition Assessment  DOCUMENTATION CODES:   Underweight  INTERVENTION:   Nepro Shake po BID, each supplement provides 425 kcal and 19 grams protein  Ocuvite daily for wound healing (provides zinc, vitamin A, vitamin C, Vitamin E, copper, and selenium)  Vitamin C 250mg  po BID  Recommend bowel regimen as needed per MD  NUTRITION DIAGNOSIS:   Increased nutrient needs related to wound healing as evidenced by increased estimated needs.  GOAL:   Patient will meet greater than or equal to 90% of their needs  MONITOR:   PO intake, Supplement acceptance, Labs, Weight trends, Skin, I & O's  REASON FOR ASSESSMENT:   Other (Comment)(low BMI)    ASSESSMENT:   48 year old with intellectual disability presenting with diabetic foot infection   RD seeing pt remotely  Pt admitted for diabetic foot wound. Pt seen by SLP 3/23 and placed on a dysphagia 1/thin liquid diet. Pt documented to be eating 50-100% of meals. Per chart, pt appears to be weight stable pta. RD will add supplements and vitamins to support wound healing. Type 1 BM noted 3/23; recommend bowel regimen as needed per MD. Plan is for possible I & D today per MD note.   Pt at high risk for malnutrition: unable to diagnose at this time as unable to perform NFPE.   Medications reviewed and include: celexa, colace, lovenox, insulin, zosyn, vancomycin  Labs reviewed: K 3.2(L) Wbc- 11.4(H) CBGS- 127, 240 x 24 hrs  Unable to complete Nutrition-Focused physical exam at this time.   Diet Order:   Diet Order            Diet NPO time specified  Diet effective now             EDUCATION NEEDS:   Not appropriate for education at this time  Skin:  Skin Assessment: Reviewed RN Assessment(diabetic foot wound right )  Last BM:  3/23- type 1  Height:   Ht Readings from Last 1 Encounters:  11/08/18 4\' 9"  (1.448 m)    Weight:   Wt Readings from Last 1 Encounters:  11/08/18 34.5 kg    Ideal Body Weight:   43.2 kg  BMI:  Body mass index is 16.45 kg/m.  Estimated Nutritional Needs:   Kcal:  1200-1400kcal/day   Protein:  52-60g/day   Fluid:  1L/day   Betsey Holiday MS, RD, LDN Pager #- [phone] Office#- [phone] After Hours Pager: [phone]

## 2018-11-10 NOTE — Progress Notes (Signed)
Patient to OR for I&D of foot wound, NPO since breakfast. Consent obtained from patients mother as patient is cognitively delayed and unable to speak. Dressing in place is clean, dry and intact. Patient has had CHG bath. IV abx infusing. Hypoglycemic event r/t patient being NPO and per family she is a brittle diabetic. Hypoglycemic protocol  Followed and patients blood sugar raised to 343. Notified MD and he states to NOT give any insulin as patient is expected to maintain NPO status and does not want to further risk another hypoglycemic event. Per MD orders will also hold night time lantus dose. Patient has been in noticeable pain and frequently moans and reaches for wound site. Medicated with PRN pain medication which seems to provide relief. Notified OR of hypoglycemic event.

## 2018-11-10 NOTE — Anesthesia Preprocedure Evaluation (Signed)
Anesthesia Evaluation  Patient identified by MRN, date of birth, ID band Patient awake    Reviewed: Allergy & Precautions, NPO status , Patient's Chart, lab work & pertinent test results, reviewed documented beta blocker date and time   Airway Mallampati: II  TM Distance: >3 FB     Dental  (+) Chipped   Pulmonary           Cardiovascular      Neuro/Psych Seizures -,  PSYCHIATRIC DISORDERS Anxiety    GI/Hepatic   Endo/Other  diabetes, Type 2  Renal/GU Renal disease     Musculoskeletal   Abdominal   Peds  Hematology   Anesthesia Other Findings Mental retardation. Blind.  Reproductive/Obstetrics                             Anesthesia Physical Anesthesia Plan  ASA: III  Anesthesia Plan: General   Post-op Pain Management:    Induction: Intravenous  PONV Risk Score and Plan:   Airway Management Planned: LMA  Additional Equipment:   Intra-op Plan:   Post-operative Plan:   Informed Consent: I have reviewed the patients History and Physical, chart, labs and discussed the procedure including the risks, benefits and alternatives for the proposed anesthesia with the patient or authorized representative who has indicated his/her understanding and acceptance.       Plan Discussed with: CRNA  Anesthesia Plan Comments:         Anesthesia Quick Evaluation

## 2018-11-10 NOTE — Progress Notes (Signed)
Hypoglycemic Event  CBG: 41  Treatment:  Symptoms: asymptomatic   Follow-up CBG: Time:1606 CBG Result:343  Possible Reasons for Event: Patient is NPO Comments/MD notified: MD Sudini aware    Sharice Harriss A Lidia Clavijo

## 2018-11-10 NOTE — Progress Notes (Signed)
Subjective/Chief Complaint: Patient seen.  Still seems to exhibit significant pain in her right foot.   Objective: Vital signs in last 24 hours: Temp:  [97.6 F (36.4 C)-97.9 F (36.6 C)] 97.6 F (36.4 C) (03/24 0654) Pulse Rate:  [83-117] 83 (03/24 0654) Resp:  [18-20] 20 (03/24 0654) BP: (136-151)/(83-89) 139/84 (03/24 0654) SpO2:  [96 %-100 %] 100 % (03/24 0654) Last BM Date: 11/09/18  Intake/Output from previous day: 03/23 0701 - 03/24 0700 In: 1574.5 [P.O.:1318; I.V.:57.2; IV Piggyback:199.3] Out: -  Intake/Output this shift: No intake/output data recorded.  Only mild drainage is noted on the bandage on the right foot.  Still some erythema and edema localized to the forefoot area.  A couple of areas of fibrotic tissue in the central aspect of the plantar ulceration.  No expressible purulence but there may be deeper infection.  Lab Results:  Recent Labs    11/08/18 1557 11/09/18 0929  WBC 24.9* 15.1*  HGB 13.0 13.4  HCT 38.5 40.3  PLT 211 200   BMET Recent Labs    11/08/18 1557 11/10/18 0648  NA 140  --   K 3.6  --   CL 101  --   CO2 29  --   GLUCOSE 216*  --   BUN 17  --   CREATININE 0.84 0.77  CALCIUM 9.3  --    PT/INR No results for input(s): LABPROT, INR in the last 72 hours. ABG No results for input(s): PHART, HCO3 in the last 72 hours.  Invalid input(s): PCO2, PO2  Studies/Results: Dg Foot 2 Views Right  Result Date: 11/08/2018 CLINICAL DATA:  abscess bottom of right foot(near distal 1st MIP jt). Entire right foot red and hot to touch Pt has cerebral palsy EXAM: RIGHT FOOT - 2 VIEW COMPARISON:  None. FINDINGS: Osseous alignment is normal. Bone mineralization is normal. No fracture line or displaced fracture fragment seen. No destructive changes, asymmetric demineralization or other signs of osteomyelitis. Presumed soft tissue swelling/edema. No soft tissue gas appreciated. IMPRESSION: 1. No osseous abnormality. No osseous fracture or  dislocation. No evidence of osteomyelitis. 2. Presumed soft tissue swelling/edema.  No soft tissue gas seen. Electronically Signed   By: Bary Richard M.D.   On: 11/08/2018 15:57    Anti-infectives: Anti-infectives (From admission, onward)   Start     Dose/Rate Route Frequency Ordered Stop   11/09/18 1600  vancomycin (VANCOCIN) 500 mg in sodium chloride 0.9 % 100 mL IVPB     500 mg 100 mL/hr over 60 Minutes Intravenous Every 24 hours 11/08/18 1933     11/09/18 1300  piperacillin-tazobactam (ZOSYN) IVPB 3.375 g     3.375 g 12.5 mL/hr over 240 Minutes Intravenous Every 8 hours 11/09/18 1239     11/09/18 0000  Ampicillin-Sulbactam (UNASYN) 3 g in sodium chloride 0.9 % 100 mL IVPB  Status:  Discontinued     3 g 200 mL/hr over 30 Minutes Intravenous Every 6 hours 11/08/18 1923 11/09/18 1239   11/08/18 1700  cefTRIAXone (ROCEPHIN) 1 g in sodium chloride 0.9 % 100 mL IVPB     1 g 200 mL/hr over 30 Minutes Intravenous  Once 11/08/18 1658 11/08/18 1828   11/08/18 1530  vancomycin (VANCOCIN) 500 mg in sodium chloride 0.9 % 100 mL IVPB     500 mg 100 mL/hr over 60 Minutes Intravenous  Once 11/08/18 1522 11/08/18 1729      Assessment/Plan: s/p * No surgery found * Assessment: Cellulitis with abscess right foot possible deeper  extension.   Plan: Will hold the patient n.p.o. after breakfast.  I would like to try to obtain an MRI today as I think she will need some type of debridement and hopefully we can have this performed this evening.  Need to obtain consent for debridement of the right foot.  We will see if they can perform MRI under sedation  LOS: 2 days    Ricci Barker 11/10/2018

## 2018-11-10 NOTE — Anesthesia Procedure Notes (Signed)
Procedure Name: LMA Insertion Date/Time: 11/10/2018 5:37 PM Performed by: Irving Burton, CRNA Pre-anesthesia Checklist: Patient identified, Emergency Drugs available, Suction available and Patient being monitored Patient Re-evaluated:Patient Re-evaluated prior to induction Oxygen Delivery Method: Circle system utilized Preoxygenation: Pre-oxygenation with 100% oxygen Induction Type: IV induction Ventilation: Mask ventilation without difficulty LMA: LMA inserted LMA Size: 4.0 Number of attempts: 1 Placement Confirmation: positive ETCO2 and breath sounds checked- equal and bilateral Tube secured with: Tape Dental Injury: Teeth and Oropharynx as per pre-operative assessment

## 2018-11-10 NOTE — Anesthesia Postprocedure Evaluation (Signed)
Anesthesia Post Note  Patient: Joann Mcconnell  Procedure(s) Performed: IRRIGATION AND DEBRIDEMENT FOOT (Right )  Patient location during evaluation: PACU Anesthesia Type: General Level of consciousness: awake and alert Pain management: pain level controlled Vital Signs Assessment: post-procedure vital signs reviewed and stable Respiratory status: spontaneous breathing, nonlabored ventilation, respiratory function stable and patient connected to nasal cannula oxygen Cardiovascular status: blood pressure returned to baseline and stable Postop Assessment: no apparent nausea or vomiting Anesthetic complications: no     Last Vitals:  Vitals:   11/10/18 2017 11/10/18 2039  BP: 109/73 138/89  Pulse: 93 99  Resp: 20 20  Temp: 37.4 C 36.9 C  SpO2: 96% 96%    Last Pain:  Vitals:   11/10/18 2039  TempSrc: Oral  PainSc:                  [name]

## 2018-11-10 NOTE — Progress Notes (Signed)
[employer] - [place] at [facility]   PATIENT NAME: Joann Mcconnell    MR#:  [id number]  DATE OF BIRTH:  12-03-1970  SUBJECTIVE:  CHIEF COMPLAINT:   Chief Complaint  Patient presents with  . Foot Pain   Nonverbal.  Not following instructions.   Seems to be in pain with her foot for nursing staff Afebrile  REVIEW OF SYSTEMS:    Review of Systems  Unable to perform ROS: Mental acuity    DRUG ALLERGIES:  No Known Allergies  VITALS:  Blood pressure 139/84, pulse 83, temperature 97.6 F (36.4 C), temperature source Oral, resp. rate 20, height 4\' 9"  (1.448 m), weight 34.5 kg, last menstrual period 08/19/2013, SpO2 100 %.  PHYSICAL EXAMINATION:   Physical Exam  GENERAL:  48 y.o.-year-old patient lying in the bed with no acute distress.  EYES: Pupils equal, round, reactive to light and accommodation. No scleral icterus. Extraocular muscles intact.  HEENT: Head atraumatic, normocephalic. Oropharynx and nasopharynx clear.  NECK:  Supple, no jugular venous distention. No thyroid enlargement, no tenderness.  LUNGS: Normal breath sounds bilaterally, no wheezing, rales, rhonchi. No use of accessory muscles of respiration.  CARDIOVASCULAR: S1, S2 normal. No murmurs, rubs, or gallops.  ABDOMEN: Soft, nontender, nondistended. Bowel sounds present. No organomegaly or mass.  EXTREMITIES: No cyanosis, clubbing or edema b/l.    Right foot dressing. Contractures NEUROLOGIC: Not following instructions. PSYCHIATRIC: The patient is alert and awake SKIN: No obvious rash, lesion, or ulcer.   LABORATORY PANEL:   CBC Recent Labs  Lab 11/10/18 0903  WBC 11.4*  HGB 12.7  HCT 38.2  PLT 213   ------------------------------------------------------------------------------------------------------------------ Chemistries  Recent Labs  Lab 11/10/18 0903  NA 143  K 3.2*  CL 106  CO2 28  GLUCOSE 202*  BUN 12  CREATININE 0.76  CALCIUM 9.2    ------------------------------------------------------------------------------------------------------------------  Cardiac Enzymes No results for input(s): TROPONINI in the last 168 hours. ------------------------------------------------------------------------------------------------------------------  RADIOLOGY:  Dg Foot 2 Views Right  Result Date: 11/08/2018 CLINICAL DATA:  abscess bottom of right foot(near distal 1st MIP jt). Entire right foot red and hot to touch Pt has cerebral palsy EXAM: RIGHT FOOT - 2 VIEW COMPARISON:  None. FINDINGS: Osseous alignment is normal. Bone mineralization is normal. No fracture line or displaced fracture fragment seen. No destructive changes, asymmetric demineralization or other signs of osteomyelitis. Presumed soft tissue swelling/edema. No soft tissue gas appreciated. IMPRESSION: 1. No osseous abnormality. No osseous fracture or dislocation. No evidence of osteomyelitis. 2. Presumed soft tissue swelling/edema.  No soft tissue gas seen. Electronically Signed   By: Bary Richard M.D.   On: 11/08/2018 15:57   ASSESSMENT AND PLAN:   Patient is 48 year old with intellectual disability presenting with diabetic foot infection  *  Diabetic left foot infection associated with cellulitis and abscess Podiatry consulted and status post debridement Wound culture pending  IV vancomycin and Unasyn Continue dressing changes. Waiting for MRI of foot Discussed with Dr. Alberteen Spindle  * Diabetes mellitus.   Uncontrolled with hyperglycemia  Hypoglycemia has improved.  Some spikes of  hyperglycemia at this time.  Amaryl was stopped.   Will continue lower dose of Levemir at this time Continue Accu-Cheks  *  Hypertension continue Cozaar  *  Hyperlipidemia continue Pravachol  *  Behavioral issues related to her intellectual disability continue her Celexa trazodone  *  Lovenox for DVT prophylaxis  All the records are reviewed and case discussed with Care  Management/Social Worker Management plans discussed with the  patient, family and they are in agreement.  CODE STATUS: FULL CODE  DVT Prophylaxis: SCDs  TOTAL CRITICAL CARE TIME TAKING CARE OF THIS PATIENT: 35 minutes.   POSSIBLE D/C IN 2-3 DAYS, DEPENDING ON CLINICAL CONDITION.  Molinda Bailiff Markelle Najarian M.D on 11/10/2018 at 10:41 AM  Between 7am to 6pm - Pager - [phone]  After 6pm go to [email or web address] - password [email or web address] [facility]  [facility] [place] [facility]  Office  [phone]  CC: Primary care physician; Lauro Regulus, MD  Note: This dictation was prepared with Dragon dictation along with smaller phrase technology. Any transcriptional errors that result from this process are unintentional.

## 2018-11-10 NOTE — TOC Initial Note (Signed)
Transition of Care [facility]) - Initial/Assessment Note    Patient Details  Name: Joann Mcconnell MRN: [id number] Date of Birth: [birth year]  Transition of Care [facility]) CM/SW Contact:    York Spaniel, LCSW Phone Number: 11/10/2018, 2:43 PM  Clinical Narrative:        CSW spoke with the representative from [facility]: Isabelle Course who stated that patient could return to her group home at discharge. CSW updated Melissa regarding patient's current medical condition.             Expected Discharge Plan: Group Home Barriers to Discharge: No Barriers Identified   Patient Goals and CMS Choice        Expected Discharge Plan and Services Expected Discharge Plan: Group Home       Living arrangements for the past 2 months: Group Home                          Prior Living Arrangements/Services Living arrangements for the past 2 months: Group Home Lives with:: Facility Resident   Do you feel safe going back to the place where you live?: Yes      Need for Family Participation in Patient Care: Yes (Comment) Care giver support system in place?: Yes (comment)      Activities of Daily Living Home Assistive Devices/Equipment: None ADL Screening (condition at time of admission) Patient's cognitive ability adequate to safely complete daily activities?: No Is the patient deaf or have difficulty hearing?: No Does the patient have difficulty seeing, even when wearing glasses/contacts?: No Does the patient have difficulty concentrating, remembering, or making decisions?: Yes Patient able to express need for assistance with ADLs?: No Does the patient have difficulty dressing or bathing?: Yes Independently performs ADLs?: No Communication: Dependent Is this a change from baseline?: Pre-admission baseline Dressing (OT): Dependent Is this a change from baseline?: Pre-admission baseline Grooming: Dependent Is this a change from baseline?: Pre-admission baseline Feeding: Needs  assistance Is this a change from baseline?: Pre-admission baseline Bathing: Dependent Is this a change from baseline?: Pre-admission baseline Toileting: Dependent Is this a change from baseline?: Pre-admission baseline In/Out Bed: Dependent Is this a change from baseline?: Pre-admission baseline Walks in Home: Dependent Is this a change from baseline?: Pre-admission baseline Does the patient have difficulty walking or climbing stairs?: Yes Weakness of Legs: Both Weakness of Arms/Hands: Both  Permission Sought/Granted                  Emotional Assessment Appearance:: Appears stated age     Orientation: : Oriented to Self Alcohol / Substance Use: Not Applicable Psych Involvement: No (comment)  Admission diagnosis:  Cellulitis of left foot [L03.116] Patient Active Problem List   Diagnosis Date Noted  . Menopause 07/08/2017  . Microcephaly (HCC) 07/08/2017  . HTN (hypertension), benign 01/09/2014  . Hyperlipidemia, unspecified 01/09/2014  . Legally blind 01/09/2014  . Intellectual disability 01/09/2014  . Seizure disorder (HCC) 01/09/2014  . 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 [place] [address] Phone: [phone] Fax: [phone]     Social Determinants of Health (SDOH) Interventions    Readmission Risk Interventions No flowsheet data found.

## 2018-11-11 ENCOUNTER — Encounter: Payer: Self-pay | Admitting: Podiatry

## 2018-11-11 LAB — CBC WITH DIFFERENTIAL/PLATELET
Abs Immature Granulocytes: 0.04 10*3/uL (ref 0.00–0.07)
Basophils Absolute: 0.1 10*3/uL (ref 0.0–0.1)
Basophils Relative: 1 %
Eosinophils Absolute: 0.3 10*3/uL (ref 0.0–0.5)
Eosinophils Relative: 2 %
HCT: 35 % — ABNORMAL LOW (ref 36.0–46.0)
HEMOGLOBIN: 11.5 g/dL — AB (ref 12.0–15.0)
Immature Granulocytes: 0 %
Lymphocytes Relative: 17 %
Lymphs Abs: 1.8 10*3/uL (ref 0.7–4.0)
MCH: 34.6 pg — ABNORMAL HIGH (ref 26.0–34.0)
MCHC: 32.9 g/dL (ref 30.0–36.0)
MCV: 105.4 fL — ABNORMAL HIGH (ref 80.0–100.0)
Monocytes Absolute: 0.9 10*3/uL (ref 0.1–1.0)
Monocytes Relative: 9 %
Neutro Abs: 7.6 10*3/uL (ref 1.7–7.7)
Neutrophils Relative %: 71 %
Platelets: 221 10*3/uL (ref 150–400)
RBC: 3.32 MIL/uL — AB (ref 3.87–5.11)
RDW: 13.2 % (ref 11.5–15.5)
WBC: 10.7 10*3/uL — ABNORMAL HIGH (ref 4.0–10.5)
nRBC: 0 % (ref 0.0–0.2)

## 2018-11-11 LAB — GLUCOSE, CAPILLARY
GLUCOSE-CAPILLARY: 123 mg/dL — AB (ref 70–99)
GLUCOSE-CAPILLARY: 129 mg/dL — AB (ref 70–99)
GLUCOSE-CAPILLARY: 140 mg/dL — AB (ref 70–99)
Glucose-Capillary: 272 mg/dL — ABNORMAL HIGH (ref 70–99)
Glucose-Capillary: 90 mg/dL (ref 70–99)
Glucose-Capillary: 157 mg/dL — ABNORMAL HIGH (ref 70–99)

## 2018-11-11 MED ORDER — POLYETHYLENE GLYCOL 3350 17 G PO PACK
17.0000 g | PACK | Freq: Every day | ORAL | Status: DC
  Administered 2018-11-11 – 2018-11-12 (×2): 17 g via ORAL
  Filled 2018-11-11 (×2): qty 1

## 2018-11-11 MED ORDER — POTASSIUM CHLORIDE 20 MEQ PO PACK
40.0000 meq | PACK | Freq: Once | ORAL | Status: AC
  Administered 2018-11-11: 40 meq via ORAL
  Filled 2018-11-11: qty 2

## 2018-11-11 MED ORDER — ENOXAPARIN SODIUM 30 MG/0.3ML ~~LOC~~ SOLN
30.0000 mg | SUBCUTANEOUS | Status: DC
  Administered 2018-11-11: 30 mg via SUBCUTANEOUS
  Filled 2018-11-11: qty 0.3

## 2018-11-11 NOTE — Progress Notes (Addendum)
PHARMACIST - PHYSICIAN COMMUNICATION  CONCERNING:  Enoxaparin (Lovenox) for DVT Prophylaxis   Assessment:  Patient was prescribed enoxaprin 40mg  q24 hours for VTE prophylaxis s/p procedure yesterday. Patient is candidate for enoxaparin 30mg  every 24 hours based on weight less then 45 kg for female.  RECOMMENDATION:   Filed Weights   11/08/18 1846  Weight: 76 lb (34.5 kg)    Body mass index is 16.45 kg/m.  Estimated Creatinine Clearance: 46.3 mL/min (by C-G formula based on SCr of 0.76 mg/dL).     DESCRIPTION: Pharmacy has adjusted enoxaparin dose..  Patient is now receiving enoxaparin 30 mg every 24 hours.  Katha Cabal, PharmD Clinical Pharmacist 11/11/2018 1:14 PM

## 2018-11-11 NOTE — Progress Notes (Signed)
per verbal order by Dr. Elpidio Anis it was okay give patient 1 early dose of pain 1mg  of morphine for pain as her foot dressing has just been changed and the patient is restless and kept on tapping her leg.

## 2018-11-11 NOTE — Progress Notes (Signed)
MD notified: Can the patient get and order for additional IV pain medicine, 1mg  of dilaudid given 1122 yet the podiatrist came to change the dressing and she is restless and taps her leg.

## 2018-11-11 NOTE — Evaluation (Signed)
Physical Therapy Evaluation Patient Details Name: Joann Mcconnell MRN: [id number] DOB: December 02, 1970 Today's Date: 11/11/2018   History of Present Illness  Joann Mcconnell is a 48yo female who comes to [facility] on 3/22 c persistent Lt foot wound, found to have cellulitis, underwent I&D on 3/24. Per RN pt is nonverbal at baseline, hence extensive info regarding baseline function is not available at this time. Message left with Group home on 3/25, have not heard back.   Clinical Impression  Pt admitted with above diagnosis. Pt currently with functional limitations due to the deficits listed below (see "PT Problem List"). Upon entry, pt in bed, sitter in room. The pt is awake, does not make eye contact when engaged. Pt is minimally interactive, although she appears offer some participatory gestures when verbal cues are given for donning shoe, sock, and initiating mobility. Functional mobility assessment demonstrates increased time requirements, good tolerance, and absolute need for physical assistance. Prior level of function has yet to be established at this time. Will continue to attempt to work with patient, but if she is unable to follow commands or unable to participate in therapy, PT may elect to prioritize all mobility with nursing. Pt will benefit from skilled PT intervention to increase independence and safety with basic mobility in preparation for discharge to the venue listed below.       Follow Up Recommendations Home health PT(return to group home; unclear baseline; unclear level of assistance caregivers can provide)    Equipment Recommendations  (unknown at this time; need info from group home caregivers)    Recommendations for Other Services       Precautions / Restrictions Precautions Precautions: Fall Restrictions Weight Bearing Restrictions: Yes RLE Weight Bearing: Partial weight bearing RLE Partial Weight Bearing Percentage or Pounds: PWB in orthowedge for heel weight bearing  transfers       Mobility  Bed Mobility Overal bed mobility: Needs Assistance Bed Mobility: Supine to Sit     Supine to sit: Mod assist     General bed mobility comments: given verbal, gestural, andtactile cues, but mostly passive mobility to EOB   Transfers Overall transfer level: Needs assistance   Transfers: Stand Pivot Transfers   Stand pivot transfers: Total assist       General transfer comment: Physical asssit provided as a cue, pt accidentallylifted into the air, feet not touching floor; pt lowerd to chair.   Ambulation/Gait                Stairs            Wheelchair Mobility    Modified Rankin (Stroke Patients Only)       Balance                                             Pertinent Vitals/Pain Pain Assessment: Faces Faces Pain Scale: No hurt    Home Living Family/patient expects to be discharged to:: Group home                      Prior Function Level of Independence: Needs assistance   Gait / Transfers Assistance Needed: unknown at this time.   ADL's / Homemaking Assistance Needed: unknown at this time.         Hand Dominance        Extremity/Trunk Assessment   Upper Extremity Assessment Upper Extremity  Assessment: Overall WFL for tasks assessed    Lower Extremity Assessment Lower Extremity Assessment: Generalized weakness       Communication      Cognition Arousal/Alertness: Lethargic(just finished napping per sitter) Behavior During Therapy: WFL for tasks assessed/performed Overall Cognitive Status: History of cognitive impairments - at baseline                                        General Comments      Exercises     Assessment/Plan    PT Assessment Patient needs continued PT services  PT Problem List Decreased strength;Decreased mobility;Decreased knowledge of precautions;Decreased cognition       PT Treatment Interventions Functional mobility  training;Therapeutic activities;Patient/family education;Therapeutic exercise;Gait training    PT Goals (Current goals can be found in the Care Plan section)  Acute Rehab PT Goals PT Goal Formulation: Patient unable to participate in goal setting    Frequency Min 2X/week   Barriers to discharge        Co-evaluation               AM-PAC PT "6 Clicks" Mobility  Outcome Measure Help needed turning from your back to your side while in a flat bed without using bedrails?: A Lot Help needed moving from lying on your back to sitting on the side of a flat bed without using bedrails?: A Lot Help needed moving to and from a bed to a chair (including a wheelchair)?: A Lot Help needed standing up from a chair using your arms (e.g., wheelchair or bedside chair)?: Total Help needed to walk in hospital room?: Total Help needed climbing 3-5 steps with a railing? : Total 6 Click Score: 9    End of Session Equipment Utilized During Treatment: Other (comment)(orthowedge shoe ) Activity Tolerance: Patient tolerated treatment well;Other (comment)(I/DD limiting participation) Patient left: in chair;with nursing/sitter in room;with chair alarm set Nurse Communication: Mobility status;Weight bearing status PT Visit Diagnosis: Other abnormalities of gait and mobility (R26.89);Difficulty in walking, not elsewhere classified (R26.2)    Time: 8811-0315 PT Time Calculation (min) (ACUTE ONLY): 11 min   Charges:   PT Evaluation $PT Eval Low Complexity: 1 Low          3:40 PM, 11/11/18 Rosamaria Lints, PT, DPT Physical Therapist - [facility]  [phone] (ASCOM)    , C 11/11/2018, 3:37 PM

## 2018-11-11 NOTE — Progress Notes (Addendum)
Pharmacy Antibiotic Note  Joann Mcconnell is a 48 y.o. female admitted on 11/08/2018 with Diabetic Foot Infection.  Pharmacy has been consulted for Vancomycin, Unasyn dosing. Since that time, the patient was switched to Zosyn for Pseudomonas coverage. Additionally, wound culture is MRSA positive with no anaerobic growth. Patient doesn't need Pseudomonas coverage but does need MRSA coverage. Per discussion with MD, anaerobic coverage is not needed.   Will need a Vancomycin trough & peak after the fifth dose (anticipated date on 11/13/2018) to assess appropriateness of current dose.   Plan: Discontinue Zosyn 3.375 gm IV Q8H.  Continue Vancomycin 500 mg IV Q24H and check trough after the fifth dose (in two days). Will continue to check Scr.    No peak and trough currently ordered.  Vd = 24.8 L Ke = 0.04 hr-1 T1/2 = 16.2 hrs  AUC = 470 Cmax = 31.3 Cmin = 11.4   Height: 4\' 9"  (144.8 cm) Weight: 76 lb (34.5 kg) IBW/kg (Calculated) : 38.6  TBW/kg: 34.5   Temp (24hrs), Avg:98.5 F (36.9 C), Min:97.4 F (36.3 C), Max:99.3 F (37.4 C)  Recent Labs  Lab 11/08/18 1557 11/09/18 0929 11/10/18 0648 11/10/18 0903 11/11/18 0345  WBC 24.9* 15.1*  --  11.4* 10.7*  CREATININE 0.84  --  0.77 0.76  --     Estimated Creatinine Clearance: 46.3 mL/min (by C-G formula based on SCr of 0.76 mg/dL).    No Known Allergies  Antimicrobials this admission:  CTX x1 3/22 Vanc 3/22 >>  Unasyn 3/23 >>3/23 Zosyn 3/23 >> 3/25  Dose adjustments this admission: N/A  Microbiology results:  Wound Cx: MRSA; no anaerobes  BCx:  N/A  UCx:   N/A  Sputum:  N/A  MRSA PCR: (-)  Antibiotic Interpretation Microscan Method Status  CIPROFLOXACIN Resistant 4 RESISTANT MIC Final  ERYTHROMYCIN Resistant >=8 RESISTANT MIC Final  GENTAMICIN Sensitive <=0.5 SENSITIVE MIC Final  OXACILLIN Resistant >=4 RESISTANT MIC Final  TETRACYCLINE Sensitive <=1 SENSITIVE MIC Final  VANCOMYCIN Sensitive <=0.5 SENSITIVE MIC  Final  TRIMETH/SULFA Sensitive <=10 SENSITIVE MIC Final  CLINDAMYCIN Sensitive <=0.25 SENSITIVE MIC Final  RIFAMPIN Sensitive <=0.5 SENSITIVE MIC Final  Inducible Clindamycin Sensitive NEGATIVE MIC Final  Susceptibility Comments   ABUNDANT METHICILLIN RESISTANT STAPHYLOCOCCUS AUREUS     Thank you for allowing pharmacy to be a part of this patient's care.  Katha Cabal 11/11/2018 9:15 AM

## 2018-11-11 NOTE — Care Management Important Message (Signed)
Important Message  Patient Details  Name: Joann Mcconnell MRN: [id number] Date of Birth: 10/30/1970   Medicare Important Message Given:  Yes    [name] 11/11/2018, 10:29 AM

## 2018-11-11 NOTE — Progress Notes (Signed)
[employer] - [place] at [facility]   PATIENT NAME: Joann Mcconnell    MR#:  [id number]  DATE OF BIRTH:  07/31/1971  SUBJECTIVE:  CHIEF COMPLAINT:   Chief Complaint  Patient presents with  . Foot Pain   Nonverbal.  Not following instructions.   IN pain tapping her foot  REVIEW OF SYSTEMS:    Review of Systems  Unable to perform ROS: Mental acuity    DRUG ALLERGIES:  No Known Allergies  VITALS:  Blood pressure (!) 150/84, pulse 78, temperature 98 F (36.7 C), temperature source Axillary, resp. rate 16, height 4\' 9"  (1.448 m), weight 34.5 kg, last menstrual period 08/19/2013, SpO2 98 %.  PHYSICAL EXAMINATION:   Physical Exam  GENERAL:  48 y.o.-year-old patient lying in the bed with no acute distress.  EYES: Pupils equal, round, reactive to light and accommodation. No scleral icterus. Extraocular muscles intact.  HEENT: Head atraumatic, normocephalic. Oropharynx and nasopharynx clear.  NECK:  Supple, no jugular venous distention. No thyroid enlargement, no tenderness.  LUNGS: Normal breath sounds bilaterally, no wheezing, rales, rhonchi. No use of accessory muscles of respiration.  CARDIOVASCULAR: S1, S2 normal. No murmurs, rubs, or gallops.  ABDOMEN: Soft, nontender, nondistended. Bowel sounds present. No organomegaly or mass.  EXTREMITIES: No cyanosis, clubbing or edema b/l.    Right foot dressing. Contractures NEUROLOGIC: Not following instructions. PSYCHIATRIC: The patient is alert and awake SKIN: No obvious rash, lesion, or ulcer.   LABORATORY PANEL:   CBC Recent Labs  Lab 11/11/18 0345  WBC 10.7*  HGB 11.5*  HCT 35.0*  PLT 221   ------------------------------------------------------------------------------------------------------------------ Chemistries  Recent Labs  Lab 11/10/18 0903  NA 143  K 3.2*  CL 106  CO2 28  GLUCOSE 202*  BUN 12  CREATININE 0.76  CALCIUM 9.2    ------------------------------------------------------------------------------------------------------------------  Cardiac Enzymes No results for input(s): TROPONINI in the last 168 hours. ------------------------------------------------------------------------------------------------------------------  RADIOLOGY:  Mr Foot Right Wo Contrast  Result Date: 11/10/2018 CLINICAL DATA:  Plantar foot diabetic ulcer. Evaluate for osteomyelitis. EXAM: MRI OF THE RIGHT FOREFOOT WITHOUT CONTRAST TECHNIQUE: Multiplanar, multisequence MR imaging of the right foot was performed. No intravenous contrast was administered. COMPARISON:  Right foot x-rays dated November 08, 2018. FINDINGS: Bones/Joint/Cartilage No marrow signal abnormality. No fracture or dislocation. Normal alignment. No joint effusion. Ligaments Collateral ligaments are intact. Muscles and Tendons Flexor, peroneal and extensor compartment tendons are intact. No muscle edema or atrophy. Soft tissue Focal plantar ulceration at the base of the third MTP joint with underlying ill-defined 7 x 15 x 7 mm (AP by transverse by CC) phlegmonous change. No discrete abscess. Moderate dorsal forefoot soft tissue swelling. No soft tissue mass. IMPRESSION: 1. Plantar ulceration at the base of the third MTP joint with underlying phlegmonous change, but no discrete abscess. 2. No evidence of osteomyelitis. Electronically Signed   By: Obie Dredge M.D.   On: 11/10/2018 10:39   ASSESSMENT AND PLAN:   Patient is 48 year old with intellectual disability presenting with diabetic foot infection  *  Diabetic left foot infection associated with cellulitis and abscess Podiatry consulted and status post debridement Wound culture MRSA  IV vancomycin and Unasyn. Stop unasyn Continue dressing changes. Dr. Alberteen Spindle following  D/C in AM  * Diabetes mellitus.   Uncontrolled with hypoglycemia  Hypoglycemia has improved.   Amaryl was stopped.    lower dose of Levemir at  this time Continue Accu-Cheks  *  Hypertension continue Cozaar  *  Hyperlipidemia continue Pravachol  *  Behavioral issues related to her intellectual disability continue her Celexa trazodone  *  Lovenox for DVT prophylaxis  All the records are reviewed and case discussed with Care Management/Social Worker Management plans discussed with the patient, family and they are in agreement.  CODE STATUS: FULL CODE  DVT Prophylaxis: SCDs  TOTAL CRITICAL CARE TIME TAKING CARE OF THIS PATIENT: 35 minutes.   POSSIBLE D/C IN 1-2  DAYS, DEPENDING ON CLINICAL CONDITION.  Molinda Bailiff Jayln Madeira M.D on 11/11/2018 at 12:32 PM  Between 7am to 6pm - Pager - [phone]  After 6pm go to [email or web address] - password [email or web address] [facility]  [facility] [place] [facility]  Office  [phone]  CC: Primary care physician; Lauro Regulus, MD  Note: This dictation was prepared with Dragon dictation along with smaller phrase technology. Any transcriptional errors that result from this process are unintentional.

## 2018-11-11 NOTE — Progress Notes (Signed)
1 Day Post-Op   Subjective/Chief Complaint: Patient seen.  Nonverbal.   Objective: Vital signs in last 24 hours: Temp:  [97.4 F (36.3 C)-99.3 F (37.4 C)] 98.6 F (37 C) (03/25 0700) Pulse Rate:  [80-113] 95 (03/25 0700) Resp:  [14-59] 14 (03/25 0700) BP: (96-138)/(61-89) 132/82 (03/25 0700) SpO2:  [95 %-100 %] 100 % (03/25 0700) Last BM Date: 11/09/18  Intake/Output from previous day: 03/24 0701 - 03/25 0700 In: 665.4 [P.O.:120; I.V.:469.1; IV Piggyback:76.2] Out: -  Intake/Output this shift: No intake/output data recorded.  Bandage on the right foot is dry and intact.  Upon removal there is some mild bleeding from the plantar wound.  The wound is well coapted with sutures intact.  Significant reduction in the erythema and edema in the right foot.  Lab Results:  Recent Labs    11/10/18 0903 11/11/18 0345  WBC 11.4* 10.7*  HGB 12.7 11.5*  HCT 38.2 35.0*  PLT 213 221   BMET Recent Labs    11/08/18 1557 11/10/18 0648 11/10/18 0903  NA 140  --  143  K 3.6  --  3.2*  CL 101  --  106  CO2 29  --  28  GLUCOSE 216*  --  202*  BUN 17  --  12  CREATININE 0.84 0.77 0.76  CALCIUM 9.3  --  9.2   PT/INR No results for input(s): LABPROT, INR in the last 72 hours. ABG No results for input(s): PHART, HCO3 in the last 72 hours.  Invalid input(s): PCO2, PO2  Studies/Results: Mr Foot Right Wo Contrast  Result Date: 11/10/2018 CLINICAL DATA:  Plantar foot diabetic ulcer. Evaluate for osteomyelitis. EXAM: MRI OF THE RIGHT FOREFOOT WITHOUT CONTRAST TECHNIQUE: Multiplanar, multisequence MR imaging of the right foot was performed. No intravenous contrast was administered. COMPARISON:  Right foot x-rays dated November 08, 2018. FINDINGS: Bones/Joint/Cartilage No marrow signal abnormality. No fracture or dislocation. Normal alignment. No joint effusion. Ligaments Collateral ligaments are intact. Muscles and Tendons Flexor, peroneal and extensor compartment tendons are intact. No  muscle edema or atrophy. Soft tissue Focal plantar ulceration at the base of the third MTP joint with underlying ill-defined 7 x 15 x 7 mm (AP by transverse by CC) phlegmonous change. No discrete abscess. Moderate dorsal forefoot soft tissue swelling. No soft tissue mass. IMPRESSION: 1. Plantar ulceration at the base of the third MTP joint with underlying phlegmonous change, but no discrete abscess. 2. No evidence of osteomyelitis. Electronically Signed   By: Obie Dredge M.D.   On: 11/10/2018 10:39    Anti-infectives: Anti-infectives (From admission, onward)   Start     Dose/Rate Route Frequency Ordered Stop   11/09/18 1600  vancomycin (VANCOCIN) 500 mg in sodium chloride 0.9 % 100 mL IVPB     500 mg 100 mL/hr over 60 Minutes Intravenous Every 24 hours 11/08/18 1933     11/09/18 1300  piperacillin-tazobactam (ZOSYN) IVPB 3.375 g     3.375 g 12.5 mL/hr over 240 Minutes Intravenous Every 8 hours 11/09/18 1239     11/09/18 0000  Ampicillin-Sulbactam (UNASYN) 3 g in sodium chloride 0.9 % 100 mL IVPB  Status:  Discontinued     3 g 200 mL/hr over 30 Minutes Intravenous Every 6 hours 11/08/18 1923 11/09/18 1239   11/08/18 1700  cefTRIAXone (ROCEPHIN) 1 g in sodium chloride 0.9 % 100 mL IVPB     1 g 200 mL/hr over 30 Minutes Intravenous  Once 11/08/18 1658 11/08/18 1828   11/08/18 1530  vancomycin (VANCOCIN) 500 mg in sodium chloride 0.9 % 100 mL IVPB     500 mg 100 mL/hr over 60 Minutes Intravenous  Once 11/08/18 1522 11/08/18 1729      Assessment/Plan: s/p Procedure(s): IRRIGATION AND DEBRIDEMENT FOOT (Right) Assessment: Improving status post I&D abscess right foot.   Plan: Xeroform and a sterile gauze bandage reapplied to the right foot.  Patient may be limited weightbearing on the right foot for transfers using a surgical shoe.  We will plan on obtaining a surgical shoe from the PACU area.  At this point I think the patient should be stable for discharge home on appropriate antibiotics.   May need occasional dressing change depending on the amount of drainage.  Plan for follow-up outpatient next week.  LOS: 3 days    Ricci Barker 11/11/2018

## 2018-11-12 DIAGNOSIS — L02611 Cutaneous abscess of right foot: Secondary | ICD-10-CM | POA: Diagnosis present

## 2018-11-12 LAB — POTASSIUM: Potassium: 4 mmol/L (ref 3.5–5.1)

## 2018-11-12 LAB — CREATININE, SERUM
Creatinine, Ser: 0.61 mg/dL (ref 0.44–1.00)
GFR calc Af Amer: >60 mL/min (ref >60)
GFR calc non Af Amer: >60 mL/min (ref >60)

## 2018-11-12 LAB — GLUCOSE, CAPILLARY
GLUCOSE-CAPILLARY: 153 mg/dL — AB (ref 70–99)
Glucose-Capillary: 167 mg/dL — ABNORMAL HIGH (ref 70–99)

## 2018-11-12 MED ORDER — DOXYCYCLINE CALCIUM 50 MG/5ML PO SYRP: 100.0000 mg | ORAL_SOLUTION | Freq: Two times a day (BID) | ORAL | 0 refills | Status: DC

## 2018-11-12 MED ORDER — TRAMADOL HCL 50 MG PO TABS: 50.0000 mg | ORAL_TABLET | Freq: Four times a day (QID) | ORAL | 0 refills | Status: AC | PRN

## 2018-11-12 NOTE — NC FL2 (Addendum)
[place] MEDICAID FL2 LEVEL OF CARE SCREENING TOOL     IDENTIFICATION  Patient Name: Joann Mcconnell Birthdate: January 19, 1971 Sex: female Admission Date (Current Location): 11/08/2018  [place] and [place] Number:  Chiropodist and Address:  [facility], [address], [place], [place] [address]      Provider Number: [id number]  Attending Physician Name and Address:  Milagros Loll, MD  Relative Name and Phone Number:       Current Level of Care: Hospital Recommended Level of Care: (group home) Prior Approval Number:    Date Approved/Denied:   PASRR Number:    Discharge Plan: ICF(group home)    Current Diagnoses: Patient Active Problem List   Diagnosis Date Noted  . Foot abscess, right 11/12/2018  . Menopause 07/08/2017  . Microcephaly (HCC) 07/08/2017  . HTN (hypertension), benign 01/09/2014  . Hyperlipidemia, unspecified 01/09/2014  . Legally blind 01/09/2014  . Intellectual disability 01/09/2014  . Seizure disorder (HCC) 01/09/2014  . 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) 01/09/2014    Orientation RESPIRATION BLADDER Height & Weight     Self, Place  Normal Incontinent Weight: 76 lb (34.5 kg) Height:  4\' 9"  (144.8 cm)  BEHAVIORAL SYMPTOMS/MOOD NEUROLOGICAL BOWEL NUTRITION STATUS  (none) Convulsions/Seizures Incontinent Diet(dysphagia 1)  AMBULATORY STATUS COMMUNICATION OF NEEDS Skin   Extensive Assist Verbally Surgical wounds                       Personal Care Assistance Level of Assistance  Bathing, Dressing, Feeding Bathing Assistance: Limited assistance Feeding assistance: Limited assistance Dressing Assistance: Limited assistance     Functional Limitations Info  (no issues noted)          SPECIAL CARE FACTORS FREQUENCY  PT (By licensed PT)   Patient will need a dry sterile dressing change 2-3 times per week   Wears surgical shoe                    Contractures  Contractures Info: Not present    Additional Factors Info  Code Status Code Status Info: full             DISCHARGE MEDICATIONS:   Allergies as of 11/12/2018   No Known Allergies        Medication List    STOP taking these medications   glimepiride 2 MG tablet Commonly known as:  AMARYL     TAKE these medications   cetirizine 10 MG tablet Commonly known as:  ZYRTEC Take 10 mg by mouth daily.   citalopram 10 MG tablet Commonly known as:  CELEXA Take 10 mg by mouth daily.   docusate sodium 100 MG capsule Commonly known as:  COLACE Take 100-200 mg by mouth 2 (two) times daily. Take 1 in the morning, and 2 at bedtime   doxycycline 50 MG/5ML Syrp Commonly known as:  VIBRAMYCIN Take 10 mLs (100 mg total) by mouth 2 (two) times daily.   Lantus SoloStar 100 UNIT/ML Solostar Pen Generic drug:  Insulin Glargine INJECT 9 UNITS SQ ONCE DAILY AT BEDTIME   LORazepam 2 MG tablet Commonly known as:  ATIVAN Take 2 mg by mouth daily as needed. Take 1 hour prior to to medical procedures   losartan 25 MG tablet Commonly known as:  COZAAR TAKE 1 TABLET BY MOUTH ONCE DAILY FOR BP   pravastatin 80 MG tablet Commonly known as:  PRAVACHOL Take 80 mg by mouth daily.  tolnaftate 1 % powder Commonly known as:  TINACTIN Apply 1 application topically 2 (two) times daily.   traMADol 50 MG tablet Commonly known as:  Ultram Take 1 tablet (50 mg total) by mouth every 6 (six) hours as needed for severe pain.   traZODone 50 MG tablet Commonly known as:  DESYREL Take 50 mg by mouth at bedtime.        Additional Information    York Spaniel, LCSW

## 2018-11-12 NOTE — Discharge Instructions (Addendum)
Diet recommended at Discharge:  Dysphagia level 1 (PUREE) w/ Thin liquids; general aspiration precautions; Support at meals w/ feeding/positioning - pt can hold cup when drinking. Pills Crushed in Puree.  Dressing changes Xeroform and a sterile gauze bandage reapplied to the right foot if dressing soaked or dirty

## 2018-11-12 NOTE — Progress Notes (Signed)
Physical Therapy Treatment Patient Details Name: Joann Mcconnell MRN: [id number] DOB: 1971/06/23 Today's Date: 11/12/2018    History of Present Illness Joann Mcconnell is a 48yo female who comes to [facility] on 3/22 c persistent Lt foot wound, found to have cellulitis, underwent I&D on 3/24. Per RN pt is nonverbal at baseline, hence extensive info regarding baseline function is not available at this time. Message left with Group home on 3/25, have not heard back.     PT Comments    Pt sleeping in L fetal position; awoken through voice/responds to name. Attempted organized exercise session; however, pt agitated throughout. Pt demonstrates ability to actively move in/out of hip/knee flex and extension actively and demonstrates resist in BLE to attempted movement as well in hip/knee flexion and hip abd/add. Outside of several active repetitions, pt resists movement and demonstrates agitation through facial grimacing and aggressive pulling at gown/hand shaking. Agitation ceases with covering pt again and no further therapy attempts. At this time, pt has current discharge.  Follow Up Recommendations  Home health PT;Other (comment)(return to group home )     Equipment Recommendations       Recommendations for Other Services       Precautions / Restrictions Precautions Precautions: Fall Restrictions Weight Bearing Restrictions: Yes RLE Weight Bearing: Partial weight bearing RLE Partial Weight Bearing Percentage or Pounds: PWB in orthowedge for heel weight bearing transfers     Mobility  Bed Mobility               General bed mobility comments: Not tested due to agitation  Transfers                    Ambulation/Gait                 Stairs             Wheelchair Mobility    Modified Rankin (Stroke Patients Only)       Balance                                            Cognition Arousal/Alertness: (initial sleeping; awakens to  voice) Behavior During Therapy: Agitated Overall Cognitive Status: History of cognitive impairments - at baseline                                 General Comments: Pt agitated shaking hands/pulling at gown upward with LE attempted work; pt resists most movemement      Exercises Other Exercises Other Exercises: Pt initially in L sidelying fetal poition. Able to straighten legs with tactile cues. Attempted ankle AROM for ankle pumps; pt demonstrates PF, does not follow for DF. Pt demonstrates active hip/knee flexion with tactile cues; continued agitation demonstrating resist to movement pushing into extension. Several abd/add with ultimate resist to movement demonstrating strength.     General Comments        Pertinent Vitals/Pain Pain Assessment: Faces Faces Pain Scale: Hurts whole lot(unclear whether pain or agitation; non verbal)    Home Living                      Prior Function            PT Goals (current goals can now be found in the care plan section) Progress  towards PT goals: Not progressing toward goals - comment    Frequency    Min 2X/week      PT Plan Current plan remains appropriate    Co-evaluation              AM-PAC PT "6 Clicks" Mobility   Outcome Measure  Help needed turning from your back to your side while in a flat bed without using bedrails?: A Lot Help needed moving from lying on your back to sitting on the side of a flat bed without using bedrails?: A Lot Help needed moving to and from a bed to a chair (including a wheelchair)?: Total Help needed standing up from a chair using your arms (e.g., wheelchair or bedside chair)?: Total Help needed to walk in hospital room?: Total Help needed climbing 3-5 steps with a railing? : Total 6 Click Score: 8    End of Session   Activity Tolerance: Treatment limited secondary to agitation Patient left: in bed;with call bell/phone within reach;with bed alarm set;with  nursing/sitter in room Nurse Communication: Other (comment)(Sitter notes pt agitated with bath this morning as well.) PT Visit Diagnosis: Other abnormalities of gait and mobility (R26.89);Difficulty in walking, not elsewhere classified (R26.2)     Time: 9563-8756 PT Time Calculation (min) (ACUTE ONLY): 18 min  Charges:  $Therapeutic Exercise: 8-22 mins                      Scot Dock, PTA 11/12/2018, 11:25 AM

## 2018-11-12 NOTE — Discharge Summary (Signed)
[facility] - [place] at [facility]   PATIENT NAME: Joann Mcconnell    MR#:  [id number]  DATE OF BIRTH:  [birth year]  DATE OF ADMISSION:  11/08/2018 ADMITTING PHYSICIAN: Auburn Bilberry, MD  DATE OF DISCHARGE: 11/12/2018  PRIMARY CARE PHYSICIAN: Lauro Regulus, MD   ADMISSION DIAGNOSIS:  Cellulitis of left foot [L03.116]  DISCHARGE DIAGNOSIS:  Active Problems:   Foot abscess, right   SECONDARY DIAGNOSIS:   Past Medical History:  Diagnosis Date  . Anxiety   . Diabetes mellitus without complication (HCC)   . Hyperlipemia   . Microcephaly (HCC)   . MR (mental retardation)   . Seizure disorder (HCC)      ADMITTING HISTORY   HISTORY OF PRESENT ILLNESS: Joann Mcconnell  is a 48 y.o. female with a known history of diabetic neuropathy and intellectual disability who was brought to the hospital with right foot swelling.  He also has drainage from the bottom aspect of her foot of clear liquid.  Patient unable to provide any review of systems her care provider from the group home is at the bedside.  She states that patient is wheelchair-bound however she does get up to go to the bathroom.  HOSPITAL COURSE:   Patient is 48 year old with intellectual disability presenting with diabetic foot infection  *Diabetic right foot infection associated with cellulitis and abscess Podiatry consulted and status post debridement in OR Wound culture MRSA  IV vancomycin and Unasyn initially and now change to PO doxycycline at d/c Continue dressing changes. Dr. Alberteen Spindle f/u as OP in 1 week   * Diabetes mellitus.   Oral meds held in hospital and will d/c at this time.  Continue insulin  *Hypertension continue Cozaar  *Hyperlipidemia continue Pravachol  *Behavioral issues related to her intellectual disability continue her Celexa trazodone  *Lovenox for DVT prophylaxis in hospital  Home health at discharge  CONSULTS OBTAINED:  Treatment Team:   Linus Galas, DPM  DRUG ALLERGIES:  No Known Allergies  DISCHARGE MEDICATIONS:   Allergies as of 11/12/2018   No Known Allergies     Medication List    STOP taking these medications   glimepiride 2 MG tablet Commonly known as:  AMARYL     TAKE these medications   cetirizine 10 MG tablet Commonly known as:  ZYRTEC Take 10 mg by mouth daily.   citalopram 10 MG tablet Commonly known as:  CELEXA Take 10 mg by mouth daily.   docusate sodium 100 MG capsule Commonly known as:  COLACE Take 100-200 mg by mouth 2 (two) times daily. Take 1 in the morning, and 2 at bedtime   doxycycline 50 MG/5ML Syrp Commonly known as:  VIBRAMYCIN Take 10 mLs (100 mg total) by mouth 2 (two) times daily.   Lantus SoloStar 100 UNIT/ML Solostar Pen Generic drug:  Insulin Glargine INJECT 9 UNITS SQ ONCE DAILY AT BEDTIME   LORazepam 2 MG tablet Commonly known as:  ATIVAN Take 2 mg by mouth daily as needed. Take 1 hour prior to to medical procedures   losartan 25 MG tablet Commonly known as:  COZAAR TAKE 1 TABLET BY MOUTH ONCE DAILY FOR BP   pravastatin 80 MG tablet Commonly known as:  PRAVACHOL Take 80 mg by mouth daily.   tolnaftate 1 % powder Commonly known as:  TINACTIN Apply 1 application topically 2 (two) times daily.   traMADol 50 MG tablet Commonly known as:  Ultram Take 1 tablet (50 mg total) by mouth every 6 (six) hours  as needed for severe pain.   traZODone 50 MG tablet Commonly known as:  DESYREL Take 50 mg by mouth at bedtime.       Today   VITAL SIGNS:  Blood pressure 135/82, pulse 78, temperature 98.2 F (36.8 C), temperature source Axillary, resp. rate 16, height 4\' 9"  (1.448 m), weight 34.5 kg, last menstrual period 08/19/2013, SpO2 97 %.  I/O:    Intake/Output Summary (Last 24 hours) at 11/12/2018 1305 Last data filed at 11/12/2018 0519 Gross per 24 hour  Intake 202.33 ml  Output -  Net 202.33 ml    PHYSICAL EXAMINATION:  Physical Exam  GENERAL:  48  y.o.-year-old patient lying in the bed with no acute distress.  LUNGS: Normal breath sounds bilaterally, no wheezing, rales,rhonchi or crepitation. No use of accessory muscles of respiration.  CARDIOVASCULAR: S1, S2 normal. No murmurs, rubs, or gallops.  NEUROLOGIC: Moves all 4 extremities. PSYCHIATRIC: The patient is non verbal and confused Right foot dressing  DATA REVIEW:   CBC Recent Labs  Lab 11/11/18 0345  WBC 10.7*  HGB 11.5*  HCT 35.0*  PLT 221    Chemistries  Recent Labs  Lab 11/10/18 0903 11/12/18 0502  NA 143  --   K 3.2* 4.0  CL 106  --   CO2 28  --   GLUCOSE 202*  --   BUN 12  --   CREATININE 0.76 0.61  CALCIUM 9.2  --     Cardiac Enzymes No results for input(s): TROPONINI in the last 168 hours.  Microbiology Results  Results for orders placed or performed during the hospital encounter of 11/08/18  Aerobic/Anaerobic Culture (surgical/deep wound)     Status: None (Preliminary result)   Collection Time: 11/08/18  8:46 PM  Result Value Ref Range Status   Specimen Description   Final    FOOT RIGHT FOOT Performed at [facility], [address], [place], [place] [address]    Special Requests   Final    NONE Performed at [facility], [address], [place], [place] [address]    Gram Stain   Final    NO WBC SEEN RARE GRAM POSITIVE COCCI IN PAIRS Performed at [facility], [address], [place], [place] [address]    Culture   Final    ABUNDANT METHICILLIN RESISTANT STAPHYLOCOCCUS AUREUS NO ANAEROBES ISOLATED; CULTURE IN PROGRESS FOR 5 DAYS    Report Status PENDING  Incomplete   Organism ID, Bacteria METHICILLIN RESISTANT STAPHYLOCOCCUS AUREUS  Final      Susceptibility   Methicillin resistant staphylococcus aureus - MIC*    CIPROFLOXACIN 4 RESISTANT Resistant     ERYTHROMYCIN >=8 RESISTANT Resistant     GENTAMICIN <=0.5 SENSITIVE Sensitive     OXACILLIN >=4 RESISTANT Resistant     TETRACYCLINE <=1 SENSITIVE  Sensitive     VANCOMYCIN <=0.5 SENSITIVE Sensitive     TRIMETH/SULFA <=10 SENSITIVE Sensitive     CLINDAMYCIN <=0.25 SENSITIVE Sensitive     RIFAMPIN <=0.5 SENSITIVE Sensitive     Inducible Clindamycin NEGATIVE Sensitive     * ABUNDANT METHICILLIN RESISTANT STAPHYLOCOCCUS AUREUS  MRSA PCR Screening     Status: None   Collection Time: 11/09/18  6:22 AM  Result Value Ref Range Status   MRSA by PCR NEGATIVE NEGATIVE Final    Comment:        The GeneXpert MRSA Assay (FDA approved for NASAL specimens only), is one component of a comprehensive MRSA colonization surveillance program. It  is not intended to diagnose MRSA infection nor to guide or monitor treatment for MRSA infections. Performed at [facility], [address] [place], [place] [address]   Aerobic/Anaerobic Culture (surgical/deep wound)     Status: None (Preliminary result)   Collection Time: 11/10/18  6:01 PM  Result Value Ref Range Status   Specimen Description   Final    WOUND RIGHT FOOT Performed at [facility], [address] [place], [place] [address]    Special Requests   Final    NONE Performed at [facility], [address] [place], [place] [address]    Gram Stain   Final    NO WBC SEEN FEW GRAM POSITIVE COCCI Performed at [facility], [address] [place], [place] [address]    Culture   Final    FEW STAPHYLOCOCCUS AUREUS NO ANAEROBES ISOLATED; CULTURE IN PROGRESS FOR 5 DAYS    Report Status PENDING  Incomplete    RADIOLOGY:  No results found.  Follow up with PCP in 1 week.  Management plans discussed with the patient, family and they are in agreement.  CODE STATUS:     Code Status Orders  (From admission, onward)         Start     Ordered   11/08/18 2046  Full code  Continuous     11/08/18 2045        Code Status History    This patient has a current code status but no historical code status.      TOTAL TIME TAKING CARE OF THIS PATIENT  ON DAY OF DISCHARGE: more than 30 minutes.   Molinda Bailiff Cadin Luka M.D on 11/12/2018 at 1:05 PM  Between 7am to 6pm - Pager - [phone]  After 6pm go to [email or web address] - password [email or web address] [facility]  [facility]  Office  [phone]  CC: Primary care physician; Lauro Regulus, MD  Note: This dictation was prepared with Dragon dictation along with smaller phrase technology. Any transcriptional errors that result from this process are unintentional.

## 2018-11-12 NOTE — TOC Transition Note (Signed)
Transition of Care [facility]) - CM/SW Discharge Note   Patient Details  Name: Joann Mcconnell MRN: [id number] Date of Birth: [birth year]  Transition of Care [facility]) CM/SW Contact:  York Spaniel, LCSW Phone Number: 11/12/2018, 3:39 PM   Clinical Narrative:   Patient to discharge today to return to [facility]. CSW coordinated with Lupita Leash and Efraim Kaufmann from [employer] regarding the discharge. As [facility] is considered an ICF (intermediate care facility), home health is a duplication of services. Lupita Leash assured CSW that her nursing staff could do the dressing changes for patient. A group home rep will transport her back to the group home.    Final next level of care: Group Home Barriers to Discharge: No Barriers Identified                                                    Readmission Risk Interventions No flowsheet data found.

## 2018-11-14 LAB — AEROBIC/ANAEROBIC CULTURE (SURGICAL/DEEP WOUND)

## 2018-11-14 LAB — AEROBIC/ANAEROBIC CULTURE W GRAM STAIN (SURGICAL/DEEP WOUND): Gram Stain: NONE SEEN

## 2018-11-15 LAB — AEROBIC/ANAEROBIC CULTURE W GRAM STAIN (SURGICAL/DEEP WOUND): Gram Stain: NONE SEEN

## 2019-01-22 ENCOUNTER — Ambulatory Visit
Admission: RE | Admit: 2019-01-22 | Discharge: 2019-01-22 | Disposition: A | Discharge status: Home / Self Care | Payer: Medicare Other | Source: Ambulatory Visit | Attending: Internal Medicine | Admitting: Internal Medicine

## 2019-01-22 ENCOUNTER — Other Ambulatory Visit: Payer: Self-pay

## 2019-01-22 DIAGNOSIS — Z1231 Encounter for screening mammogram for malignant neoplasm of breast: Secondary | ICD-10-CM | POA: Diagnosis present

## 2019-07-14 ENCOUNTER — Encounter: Payer: Medicare Other | Admitting: Obstetrics and Gynecology

## 2019-07-22 ENCOUNTER — Encounter: Payer: Medicare Other | Admitting: Obstetrics and Gynecology

## 2019-07-23 ENCOUNTER — Encounter: Payer: Medicare Other | Admitting: Obstetrics and Gynecology

## 2019-11-02 NOTE — Progress Notes (Signed)
Pt is present today for her annual exam. Pt's care taker stated that the pt is doing well no problems.  Pt had both vaccines of COVID-19.

## 2019-11-03 ENCOUNTER — Encounter: Payer: Self-pay | Admitting: Obstetrics and Gynecology

## 2019-11-03 ENCOUNTER — Ambulatory Visit (INDEPENDENT_AMBULATORY_CARE_PROVIDER_SITE_OTHER): Payer: Medicare Other | Admitting: Obstetrics and Gynecology

## 2019-11-03 ENCOUNTER — Other Ambulatory Visit: Payer: Self-pay

## 2019-11-03 VITALS — BP 151/97 | HR 114 | Wt 78.0 lb

## 2019-11-03 DIAGNOSIS — Z1211 Encounter for screening for malignant neoplasm of colon: Secondary | ICD-10-CM

## 2019-11-03 DIAGNOSIS — G40909 Epilepsy, unspecified, not intractable, without status epilepticus: Secondary | ICD-10-CM | POA: Diagnosis not present

## 2019-11-03 DIAGNOSIS — Z01419 Encounter for gynecological examination (general) (routine) without abnormal findings: Secondary | ICD-10-CM | POA: Diagnosis not present

## 2019-11-03 DIAGNOSIS — Q02 Microcephaly: Secondary | ICD-10-CM

## 2019-11-03 DIAGNOSIS — Z1231 Encounter for screening mammogram for malignant neoplasm of breast: Secondary | ICD-10-CM | POA: Diagnosis not present

## 2019-11-03 DIAGNOSIS — F79 Unspecified intellectual disabilities: Secondary | ICD-10-CM

## 2019-11-03 DIAGNOSIS — Z780 Asymptomatic menopausal state: Secondary | ICD-10-CM

## 2019-11-03 NOTE — Patient Instructions (Signed)

## 2019-11-03 NOTE — Progress Notes (Signed)
ANNUAL PREVENTATIVE CARE GYNECOLOGY  ENCOUNTER NOTE  Subjective:       Joann Mcconnell is a 49 y.o. G0P0000 female here for a routine annual gynecologic exam.  She is transitioning care from Dr. Hassell Done Defrancesco who has retired. She was last seen in 2019.  [place] has a medical history significant for microcephaly, seizure disorder, mental retardation. Resides in a group home. She is also a patient at the [facility]. The patient has never been sexually active. The patient has never been on hormone replacement therapy. Patient denies post-menopausal vaginal bleeding.  Has the patient ever been transfused or tattooed?: no.   History obtained from caregiver, chart review and unobtainable from patient due to mental status.   Current complaints: 1.  None   Gynecologic History Patient's last menstrual period was 08/19/2013. Contraception: post menopausal status Last Pap: 06/16/2012. Results were: normal Last mammogram: 02/11/2019. Results were: normal Last Colonoscopy: patient has never had one.     Obstetric History OB History  Gravida Para Term Preterm AB Living  0 0 0 0 0 0  SAB TAB Ectopic Multiple Live Births  0 0 0 0      Past Medical History:  Diagnosis Date  . Anxiety   . Diabetes mellitus without complication ([place])   . Hyperlipemia   . Microcephaly ([place])   . MR (mental retardation)   . Seizure disorder ([place])     Family History  Family history unknown: Yes    Past Surgical History:  Procedure Laterality Date  . IRRIGATION AND DEBRIDEMENT FOOT Right 11/10/2018   Procedure: IRRIGATION AND DEBRIDEMENT FOOT;  Surgeon: Sharlotte Alamo, DPM;  Location: [facility] ORS;  Service: Podiatry;  Laterality: Right;  . NO PAST SURGERIES      Social History   Socioeconomic History  . Marital status: Single    Spouse name: Not on file  . Number of children: Not on file  . Years of education: Not on file  . Highest education level: Not on file  Occupational History  . Not  on file  Tobacco Use  . Smoking status: Never Smoker  . Smokeless tobacco: Never Used  Substance and Sexual Activity  . Alcohol use: No  . Drug use: No  . Sexual activity: Never  Other Topics Concern  . Not on file  Social History Narrative  . Not on file   Social Determinants of Health   Financial Resource Strain:   . Difficulty of Paying Living Expenses:   Food Insecurity:   . Worried About Charity fundraiser in the Last Year:   . Arboriculturist in the Last Year:   Transportation Needs:   . Film/video editor (Medical):   Marland Kitchen Lack of Transportation (Non-Medical):   Physical Activity:   . Days of Exercise per Week:   . Minutes of Exercise per Session:   Stress:   . Feeling of Stress :   Social Connections:   . Frequency of Communication with Friends and Family:   . Frequency of Social Gatherings with Friends and Family:   . Attends Religious Services:   . Active Member of Clubs or Organizations:   . Attends Archivist Meetings:   Marland Kitchen Marital Status:   Intimate Partner Violence:   . Fear of Current or Ex-Partner:   . Emotionally Abused:   Marland Kitchen Physically Abused:   . Sexually Abused:     Current Outpatient Medications on File Prior to Visit  Medication Sig Dispense  Refill  . cetirizine (ZYRTEC) 10 MG tablet Take 10 mg by mouth daily.    . citalopram (CELEXA) 10 MG tablet Take 10 mg by mouth daily.    Marland Kitchen docusate sodium (COLACE) 100 MG capsule Take 100-200 mg by mouth 2 (two) times daily. Take 1 in the morning, and 2 at bedtime    . doxycycline (VIBRAMYCIN) 50 MG/5ML SYRP Take 10 mLs (100 mg total) by mouth 2 (two) times daily. 200 mL 0  . Insulin Glargine (LANTUS SOLOSTAR) 100 UNIT/ML Solostar Pen INJECT 9 UNITS SQ ONCE DAILY AT BEDTIME    . LORazepam (ATIVAN) 2 MG tablet Take 2 mg by mouth daily as needed. Take 1 hour prior to to medical procedures    . losartan (COZAAR) 25 MG tablet TAKE 1 TABLET BY MOUTH ONCE DAILY FOR BP    . pravastatin (PRAVACHOL) 80 MG  tablet Take 80 mg by mouth daily.    Marland Kitchen tolnaftate (TINACTIN) 1 % powder Apply 1 application topically 2 (two) times daily.    . traMADol (ULTRAM) 50 MG tablet Take 1 tablet (50 mg total) by mouth every 6 (six) hours as needed for severe pain. 20 tablet 0  . traZODone (DESYREL) 50 MG tablet Take 50 mg by mouth at bedtime.     No current facility-administered medications on file prior to visit.    No Known Allergies    Review of Systems ROS Review of Systems - History obtained from caregiver and unobtainable from patient due to mental status. Per caregiver, no issues regarding bowel or bladder dysfunction, vaginal or breast complaints, or abnormal uterine bleeding.   Objective:   BP (!) 151/97   Pulse (!) 114   Wt 78 lb (35.4 kg)   LMP 08/19/2013   BMI 16.88 kg/m  CONSTITUTIONAL:Disabled patient with microcephaly; well-nourished female.  PSYCHIATRIC: Mental retardation changes; response to requests for lying down with assistance from health care providers NEUROLGIC: Alert. Unable to assess orientation.  HENT:  Normocephalic, atraumatic, External right and left ear normal. Oropharynx is clear and moist. Poor dentition EYES: Conjunctivae and EOM are normal. Pupils are equal, round, and reactive to light. No scleral icterus.  NECK: Normal range of motion, supple, no masses.  Normal thyroid.  SKIN: Skin is warm and dry. No rash noted. Not diaphoretic. No erythema. No pallor. CARDIOVASCULAR: Mild tachycardia noted, regular rhythm, no murmur. RESPIRATORY: Clear to auscultation bilaterally. Effort and breath sounds normal, no problems with respiration noted. BREASTS: Symmetric in size. No masses, skin changes, nipple drainage, or lymphadenopathy. ABDOMEN: Rigid, voluntary guarding present, but non-tender.  Normal bowel sounds, no distention noted.   BLADDER: Normal PELVIC:  Bladder no bladder distension noted  Vulva: normal appearing vulva with no masses, tenderness or lesions  Vagina:  deferred. Patient intolerant of exam, not cooperative.   Cervix: Patient intolerant of exam, not cooperative.   Uterus: Patient intolerant of exam, not cooperative.   Adnexa: Patient intolerant of exam, not cooperative.   RV: Not performed  MUSCULOSKELETAL: Mild retraction of the arms at rest (flexion at forearms). No tenderness.  No cyanosis, clubbing, or edema.  2+ distal pulses. LYMPHATIC: No Axillary, Supraclavicular, or Inguinal Adenopathy.   Labs: Lab Results  Component Value Date   WBC 10.7 (H) 11/11/2018   HGB 11.5 (L) 11/11/2018   HCT 35.0 (L) 11/11/2018   MCV 105.4 (H) 11/11/2018   PLT 221 11/11/2018    Lab Results  Component Value Date   CREATININE 0.61 11/12/2018   BUN 12 11/10/2018  NA 143 11/10/2018   K 4.0 11/12/2018   CL 106 11/10/2018   CO2 28 11/10/2018    No results found for: ALT, AST, GGT, ALKPHOS, BILITOT  No results found for: CHOL, HDL, LDLCALC, LDLDIRECT, TRIG, CHOLHDL  No results found for: TSH  No results found for: HGBA1C   Assessment:   Annual gynecologic examination 49 y.o. Contraception: postmenopausal status Normal BMI Microcephaly Seizure disorder Mental retardation  Plan:  - Pap: Last pap 2013. Patient has never been sexually active, no history of cervical cancer in self of family. Unless patient has pelvic complaints, vaginal exam can be deferred due to mental status, intolerance to exams, and difficulty with pelvic exams in office.  - Mammogram: Ordered - Stool Guaiac Testing:  Discussed need for colon cancer screening. Caregiver notes that it is difficult to capture stool sample from patient.  Could consider blood testing if insurance covers. Otherwise, patient may be evaluated for possible colonoscopy.  - Labs: None ordered. To be performed by PCP.  - Routine preventative health maintenance measures emphasized with caregiver.  - Return to Clinic - 1 Year   Hildred Laser, MD  [facility]

## 2019-11-05 ENCOUNTER — Telehealth: Payer: Self-pay | Admitting: Obstetrics and Gynecology

## 2019-11-05 NOTE — Telephone Encounter (Signed)
Joann Mcconnell called in and stated that she is requesting a call back about if medicaid is covered for [place]'s colpo. Joann Mcconnell is requesting a call back. Please advise

## 2019-11-29 NOTE — Telephone Encounter (Signed)
Informed stated that I will speak to the lady on Wednesday concerning cologuard and if medicaid will pay for the service. Will give them a call back after speaking to that cologuard lady on Wednesday. staff stated that they understood.

## 2019-12-14 NOTE — Addendum Note (Signed)
Addended by: Silvano Bilis on: 12/14/2019 08:22 AM   Modules accepted: Orders

## 2019-12-14 NOTE — Telephone Encounter (Signed)
Pt's caregiver is aware that the pt's cologuard order has been placed.

## 2019-12-26 LAB — COLOGUARD

## 2020-01-04 ENCOUNTER — Telehealth: Payer: Self-pay | Admitting: Obstetrics and Gynecology

## 2020-01-04 NOTE — Telephone Encounter (Signed)
Patients care giver called in asking if we had gotten the results on her cologuard?? Informed caregiver I'd send a message to nurse to advise.

## 2020-01-06 NOTE — Telephone Encounter (Signed)
Pt's caretaker is informed that we had received a letter from cologuard stating that the specimen was not collected as instructed and that a new kit would be mailed to the home.

## 2020-01-24 ENCOUNTER — Ambulatory Visit
Admission: RE | Admit: 2020-01-24 | Discharge: 2020-01-24 | Disposition: A | Discharge status: Home / Self Care | Payer: Medicare Other | Source: Ambulatory Visit | Attending: Obstetrics and Gynecology | Admitting: Obstetrics and Gynecology

## 2020-01-24 DIAGNOSIS — Z1231 Encounter for screening mammogram for malignant neoplasm of breast: Secondary | ICD-10-CM

## 2020-11-10 ENCOUNTER — Encounter: Payer: Self-pay | Admitting: Obstetrics and Gynecology

## 2020-11-10 ENCOUNTER — Ambulatory Visit (INDEPENDENT_AMBULATORY_CARE_PROVIDER_SITE_OTHER): Payer: Medicare Other | Admitting: Obstetrics and Gynecology

## 2020-11-10 ENCOUNTER — Other Ambulatory Visit: Payer: Self-pay

## 2020-11-10 ENCOUNTER — Other Ambulatory Visit (HOSPITAL_COMMUNITY)
Admission: RE | Admit: 2020-11-10 | Discharge: 2020-11-10 | Disposition: A | Discharge status: Home / Self Care | Payer: Medicare Other | Source: Ambulatory Visit | Attending: Obstetrics and Gynecology | Admitting: Obstetrics and Gynecology

## 2020-11-10 VITALS — Ht <= 58 in

## 2020-11-10 DIAGNOSIS — Z124 Encounter for screening for malignant neoplasm of cervix: Secondary | ICD-10-CM

## 2020-11-10 DIAGNOSIS — Z1151 Encounter for screening for human papillomavirus (HPV): Secondary | ICD-10-CM | POA: Diagnosis not present

## 2020-11-10 DIAGNOSIS — Z01419 Encounter for gynecological examination (general) (routine) without abnormal findings: Secondary | ICD-10-CM | POA: Diagnosis not present

## 2020-11-10 DIAGNOSIS — Z780 Asymptomatic menopausal state: Secondary | ICD-10-CM | POA: Diagnosis not present

## 2020-11-10 DIAGNOSIS — Z1231 Encounter for screening mammogram for malignant neoplasm of breast: Secondary | ICD-10-CM | POA: Diagnosis not present

## 2020-11-10 DIAGNOSIS — G40909 Epilepsy, unspecified, not intractable, without status epilepticus: Secondary | ICD-10-CM

## 2020-11-10 DIAGNOSIS — F79 Unspecified intellectual disabilities: Secondary | ICD-10-CM

## 2020-11-10 NOTE — Progress Notes (Signed)
Pt present for annual exam. Pt's caretaker stated that she was doing well.

## 2020-11-10 NOTE — Patient Instructions (Signed)
Preventive Care 40-52 Years Old, Female Preventive care refers to lifestyle choices and visits with your health care provider that can promote health and wellness. This includes:  A yearly physical exam. This is also called an annual wellness visit.  Regular dental and eye exams.  Immunizations.  Screening for certain conditions.  Healthy lifestyle choices, such as: ? Eating a healthy diet. ? Getting regular exercise. ? Not using drugs or products that contain nicotine and tobacco. ? Limiting alcohol use. What can I expect for my preventive care visit? Physical exam Your health care provider will check your:  Height and weight. These may be used to calculate your BMI (body mass index). BMI is a measurement that tells if you are at a healthy weight.  Heart rate and blood pressure.  Body temperature.  Skin for abnormal spots. Counseling Your health care provider may ask you questions about your:  Past medical problems.  Family's medical history.  Alcohol, tobacco, and drug use.  Emotional well-being.  Home life and relationship well-being.  Sexual activity.  Diet, exercise, and sleep habits.  Work and work environment.  Access to firearms.  Method of birth control.  Menstrual cycle.  Pregnancy history. What immunizations do I need? Vaccines are usually given at various ages, according to a schedule. Your health care provider will recommend vaccines for you based on your age, medical history, and lifestyle or other factors, such as travel or where you work.   What tests do I need? Blood tests  Lipid and cholesterol levels. These may be checked every 5 years, or more often if you are over 50 years old.  Hepatitis C test.  Hepatitis B test. Screening  Lung cancer screening. You may have this screening every year starting at age 55 if you have a 30-pack-year history of smoking and currently smoke or have quit within the past 15 years.  Colorectal cancer  screening. ? All adults should have this screening starting at age 50 and continuing until age 75. ? Your health care provider may recommend screening at age 45 if you are at increased risk. ? You will have tests every 1-10 years, depending on your results and the type of screening test.  Diabetes screening. ? This is done by checking your blood sugar (glucose) after you have not eaten for a while (fasting). ? You may have this done every 1-3 years.  Mammogram. ? This may be done every 1-2 years. ? Talk with your health care provider about when you should start having regular mammograms. This may depend on whether you have a family history of breast cancer.  BRCA-related cancer screening. This may be done if you have a family history of breast, ovarian, tubal, or peritoneal cancers.  Pelvic exam and Pap test. ? This may be done every 3 years starting at age 21. ? Starting at age 30, this may be done every 5 years if you have a Pap test in combination with an HPV test. Other tests  STD (sexually transmitted disease) testing, if you are at risk.  Bone density scan. This is done to screen for osteoporosis. You may have this scan if you are at high risk for osteoporosis. Talk with your health care provider about your test results, treatment options, and if necessary, the need for more tests. Follow these instructions at home: Eating and drinking  Eat a diet that includes fresh fruits and vegetables, whole grains, lean protein, and low-fat dairy products.  Take vitamin and mineral supplements   supplements as recommended by your health care provider.  Do not drink alcohol if: ? Your health care provider tells you not to drink. ? You are pregnant, may be pregnant, or are planning to become pregnant.  If you drink alcohol: ? Limit how much you have to 0-1 drink a day. ? Be aware of how much alcohol is in your drink. In the [place] one drink equals one 12 oz bottle of beer (355 mL), one 5 oz  glass of wine (148 mL), or one 1 oz glass of hard liquor (44 mL).   Lifestyle  Take daily care of your teeth and gums. Brush your teeth every morning and night with fluoride toothpaste. Floss one time each day.  Stay active. Exercise for at least 30 minutes 5 or more days each week.  Do not use any products that contain nicotine or tobacco, such as cigarettes, e-cigarettes, and chewing tobacco. If you need help quitting, ask your health care provider.  Do not use drugs.  If you are sexually active, practice safe sex. Use a condom or other form of protection to prevent STIs (sexually transmitted infections).  If you do not wish to become pregnant, use a form of birth control. If you plan to become pregnant, see your health care provider for a prepregnancy visit.  If told by your health care provider, take low-dose aspirin daily starting at age 25.  Find healthy ways to cope with stress, such as: ? Meditation, yoga, or listening to music. ? Journaling. ? Talking to a trusted person. ? Spending time with friends and family. Safety  Always wear your seat belt while driving or riding in a vehicle.  Do not drive: ? If you have been drinking alcohol. Do not ride with someone who has been drinking. ? When you are tired or distracted. ? While texting.  Wear a helmet and other protective equipment during sports activities.  If you have firearms in your house, make sure you follow all gun safety procedures. What's next?  Visit your health care provider once a year for an annual wellness visit.  Ask your health care provider how often you should have your eyes and teeth checked.  Stay up to date on all vaccines. This information is not intended to replace advice given to you by your health care provider. Make sure you discuss any questions you have with your health care provider. Document Revised: 05/09/2020 Document Reviewed: 04/16/2018 Elsevier Patient Education  2021 [employer] Breast self-awareness means being familiar with how your breasts look and feel. It involves checking your breasts regularly and reporting any changes to your health care provider. Practicing breast self-awareness is important. Sometimes changes may not be harmful (are benign), but sometimes a change in your breasts can be a sign of a serious medical problem. It is important to learn how to do this procedure correctly so that you can catch problems early, when treatment is more likely to be successful. All women should practice breast self-awareness, including women who have had breast implants. What you need:  A mirror.  A well-lit room. How to do a breast self-exam A breast self-exam is one way to learn what is normal for your breasts and whether your breasts are changing. To do a breast self-exam: Look for changes 1. Remove all the clothing above your waist. 2. Stand in front of a mirror in a room with good lighting. 3. Put your hands on your hips. 4. Push  your hands firmly downward. 5. Compare your breasts in the mirror. Look for differences between them (asymmetry), such as: ? Differences in shape. ? Differences in size. ? Puckers, dips, and bumps in one breast and not the other. 6. Look at each breast for changes in the skin, such as: ? Redness. ? Scaly areas. 7. Look for changes in your nipples, such as: ? Discharge. ? Bleeding. ? Dimpling. ? Redness. ? A change in position.   Feel for changes Carefully feel your breasts for lumps and changes. It is best to do this while lying on your back on the floor, and again while sitting or standing in the tub or shower with soapy water on your skin. Feel each breast in the following way: 1. Place the arm on the side of the breast you are examining above your head. 2. Feel your breast with the other hand. 3. Start in the nipple area and make -inch (2 cm) overlapping circles to feel your breast. Use the pads  of your three middle fingers to do this. Apply light pressure, then medium pressure, then firm pressure. The light pressure will allow you to feel the tissue closest to the skin. The medium pressure will allow you to feel the tissue that is a little deeper. The firm pressure will allow you to feel the tissue close to the ribs. 4. Continue the overlapping circles, moving downward over the breast until you feel your ribs below your breast. 5. Move one finger-width toward the center of the body. Continue to use the -inch (2 cm) overlapping circles to feel your breast as you move slowly up toward your collarbone. 6. Continue the up-and-down exam using all three pressures until you reach your armpit.   Write down what you find Writing down what you find can help you remember what to discuss with your health care provider. Write down:  What is normal for each breast.  Any changes that you find in each breast, including: ? The kind of changes you find. ? Any pain or tenderness. ? Size and location of any lumps.  Where you are in your menstrual cycle, if you are still menstruating. General tips and recommendations  Examine your breasts every month.  If you are breastfeeding, the best time to examine your breasts is after a feeding or after using a breast pump.  If you menstruate, the best time to examine your breasts is 5-7 days after your period. Breasts are generally lumpier during menstrual periods, and it may be more difficult to notice changes.  With time and practice, you will become more familiar with the variations in your breasts and more comfortable with the exam. Contact a health care provider if you:  See a change in the shape or size of your breasts or nipples.  See a change in the skin of your breast or nipples, such as a reddened or scaly area.  Have unusual discharge from your nipples.  Find a lump or thick area that was not there before.  Have pain in your breasts.  Have  any concerns related to your breast health. Summary  Breast self-awareness includes looking for physical changes in your breasts, as well as feeling for any changes within your breasts.  Breast self-awareness should be performed in front of a mirror in a well-lit room.  You should examine your breasts every month. If you menstruate, the best time to examine your breasts is 5-7 days after your menstrual period.  Let your health  care provider know of any changes you notice in your breasts, including changes in size, changes on the skin, pain or tenderness, or unusual fluid from your nipples. This information is not intended to replace advice given to you by your health care provider. Make sure you discuss any questions you have with your health care provider. Document Revised: 03/24/2018 Document Reviewed: 03/24/2018 Elsevier Patient Education  2021 [employer] Preventive Care 84-85 Years Old, Female Preventive care refers to lifestyle choices and visits with your health care provider that can promote health and wellness. This includes:  A yearly physical exam. This is also called an annual wellness visit.  Regular dental and eye exams.  Immunizations.  Screening for certain conditions.  Healthy lifestyle choices, such as: ? Eating a healthy diet. ? Getting regular exercise. ? Not using drugs or products that contain nicotine and tobacco. ? Limiting alcohol use. What can I expect for my preventive care visit? Physical exam Your health care provider will check your:  Height and weight. These may be used to calculate your BMI (body mass index). BMI is a measurement that tells if you are at a healthy weight.  Heart rate and blood pressure.  Body temperature.  Skin for abnormal spots. Counseling Your health care provider may ask you questions about your:  Past medical problems.  Family's medical history.  Alcohol, tobacco, and drug use.  Emotional well-being.  Home  life and relationship well-being.  Sexual activity.  Diet, exercise, and sleep habits.  Work and work Statistician.  Access to firearms.  Method of birth control.  Menstrual cycle.  Pregnancy history. What immunizations do I need? Vaccines are usually given at various ages, according to a schedule. Your health care provider will recommend vaccines for you based on your age, medical history, and lifestyle or other factors, such as travel or where you work.   What tests do I need? Blood tests  Lipid and cholesterol levels. These may be checked every 5 years, or more often if you are over 63 years old.  Hepatitis C test.  Hepatitis B test. Screening  Lung cancer screening. You may have this screening every year starting at age 82 if you have a 30-pack-year history of smoking and currently smoke or have quit within the past 15 years.  Colorectal cancer screening. ? All adults should have this screening starting at age 34 and continuing until age 75. ? Your health care provider may recommend screening at age 21 if you are at increased risk. ? You will have tests every 1-10 years, depending on your results and the type of screening test.  Diabetes screening. ? This is done by checking your blood sugar (glucose) after you have not eaten for a while (fasting). ? You may have this done every 1-3 years.  Mammogram. ? This may be done every 1-2 years. ? Talk with your health care provider about when you should start having regular mammograms. This may depend on whether you have a family history of breast cancer.  BRCA-related cancer screening. This may be done if you have a family history of breast, ovarian, tubal, or peritoneal cancers.  Pelvic exam and Pap test. ? This may be done every 3 years starting at age 77. ? Starting at age 15, this may be done every 5 years if you have a Pap test in combination with an HPV test. Other tests  STD (sexually transmitted disease) testing,  if you are at risk.  Bone density scan. This  is done to screen for osteoporosis. You may have this scan if you are at high risk for osteoporosis. Talk with your health care provider about your test results, treatment options, and if necessary, the need for more tests. Follow these instructions at home: Eating and drinking  Eat a diet that includes fresh fruits and vegetables, whole grains, lean protein, and low-fat dairy products.  Take vitamin and mineral supplements as recommended by your health care provider.  Do not drink alcohol if: ? Your health care provider tells you not to drink. ? You are pregnant, may be pregnant, or are planning to become pregnant.  If you drink alcohol: ? Limit how much you have to 0-1 drink a day. ? Be aware of how much alcohol is in your drink. In the [place] one drink equals one 12 oz bottle of beer (355 mL), one 5 oz glass of wine (148 mL), or one 1 oz glass of hard liquor (44 mL).   Lifestyle  Take daily care of your teeth and gums. Brush your teeth every morning and night with fluoride toothpaste. Floss one time each day.  Stay active. Exercise for at least 30 minutes 5 or more days each week.  Do not use any products that contain nicotine or tobacco, such as cigarettes, e-cigarettes, and chewing tobacco. If you need help quitting, ask your health care provider.  Do not use drugs.  If you are sexually active, practice safe sex. Use a condom or other form of protection to prevent STIs (sexually transmitted infections).  If you do not wish to become pregnant, use a form of birth control. If you plan to become pregnant, see your health care provider for a prepregnancy visit.  If told by your health care provider, take low-dose aspirin daily starting at age 27.  Find healthy ways to cope with stress, such as: ? Meditation, yoga, or listening to music. ? Journaling. ? Talking to a trusted person. ? Spending time with friends and  family. Safety  Always wear your seat belt while driving or riding in a vehicle.  Do not drive: ? If you have been drinking alcohol. Do not ride with someone who has been drinking. ? When you are tired or distracted. ? While texting.  Wear a helmet and other protective equipment during sports activities.  If you have firearms in your house, make sure you follow all gun safety procedures. What's next?  Visit your health care provider once a year for an annual wellness visit.  Ask your health care provider how often you should have your eyes and teeth checked.  Stay up to date on all vaccines. This information is not intended to replace advice given to you by your health care provider. Make sure you discuss any questions you have with your health care provider. Document Revised: 05/09/2020 Document Reviewed: 04/16/2018 Elsevier Patient Education  2021 [employer].

## 2020-11-10 NOTE — Progress Notes (Signed)
ANNUAL PREVENTATIVE CARE GYNECOLOGY  ENCOUNTER NOTE  Subjective:   History obtained from caregiver, chart review and unobtainable from patient due to mental status.      Joann Mcconnell is a 50 y.o. G0P0000 female here for a routine annual gynecologic exam. She has a medical history significant for microcephaly, seizure disorder, mental retardation. Resides in a group home. She is also a patient at the [facility]. The patient has never been sexually active. The patient has never been on hormone replacement therapy. Patient has no history post-menopausal vaginal bleeding.  Has the patient ever been transfused or tattooed?: no.    Current complaints: 1.  None.  However since patient's last visit, she has developed some weakness in her legs. Is requiring use of wheelchair for mobility.    Gynecologic History Patient's last menstrual period was 08/19/2013. Contraception: post menopausal status Last Pap: 06/16/2012. Results were: normal Last mammogram: 01/24/2020. Results were: normal. BIRADS Category I, but limited due to patient being non-cooperative during exam.  Last Colonoscopy: patient has never had one.     Obstetric History OB History  Gravida Para Term Preterm AB Living  0 0 0 0 0 0  SAB IAB Ectopic Multiple Live Births  0 0 0 0      Past Medical History:  Diagnosis Date  . Anxiety   . Cataract   . Diabetes mellitus without complication (HCC)   . Hyperlipemia   . Microcephaly (HCC)   . MR (mental retardation)   . Seizure disorder (HCC)     Family History  Problem Relation Age of Onset  . Breast cancer Neg Hx     Past Surgical History:  Procedure Laterality Date  . IRRIGATION AND DEBRIDEMENT FOOT Right 11/10/2018   Procedure: IRRIGATION AND DEBRIDEMENT FOOT;  Surgeon: Linus Galas, DPM;  Location: [facility] ORS;  Service: Podiatry;  Laterality: Right;  . NO PAST SURGERIES      Social History   Socioeconomic History  . Marital status: Single    Spouse  name: Not on file  . Number of children: Not on file  . Years of education: Not on file  . Highest education level: Not on file  Occupational History  . Not on file  Tobacco Use  . Smoking status: Never Smoker  . Smokeless tobacco: Never Used  Vaping Use  . Vaping Use: Never used  Substance and Sexual Activity  . Alcohol use: No  . Drug use: No  . Sexual activity: Never  Other Topics Concern  . Not on file  Social History Narrative  . Not on file   Social Determinants of Health   Financial Resource Strain: Not on file  Food Insecurity: Not on file  Transportation Needs: Not on file  Physical Activity: Not on file  Stress: Not on file  Social Connections: Not on file  Intimate Partner Violence: Not on file    Current Outpatient Medications on File Prior to Visit  Medication Sig Dispense Refill  . cetirizine (ZYRTEC) 10 MG tablet Take 10 mg by mouth daily.    . Cholecalciferol (VITAMIN D3) 50 MCG (2000 UT) TABS Take by mouth.    . citalopram (CELEXA) 10 MG tablet Take 10 mg by mouth daily.    Marland Kitchen docusate sodium (COLACE) 100 MG capsule Take 100-200 mg by mouth 2 (two) times daily. Take 1 in the morning, and 2 at bedtime    . glimepiride (AMARYL) 2 MG tablet Take 2 mg by mouth daily with  breakfast.    . insulin glargine (LANTUS) 100 UNIT/ML Solostar Pen INJECT 9 UNITS SQ ONCE DAILY AT BEDTIME    . LORazepam (ATIVAN) 2 MG tablet Take 2 mg by mouth daily as needed. Take 1 hour prior to to medical procedures    . losartan (COZAAR) 25 MG tablet TAKE 1 TABLET BY MOUTH ONCE DAILY FOR BP    . pravastatin (PRAVACHOL) 80 MG tablet Take 80 mg by mouth daily.    Marland Kitchen tolnaftate (TINACTIN) 1 % powder Apply 1 application topically 2 (two) times daily.    . traZODone (DESYREL) 50 MG tablet Take 50 mg by mouth at bedtime.    Marland Kitchen zinc sulfate 220 (50 Zn) MG capsule Take 220 mg by mouth daily.     No current facility-administered medications on file prior to visit.    No Known  Allergies    Review of Systems ROS Review of Systems - History obtained from caregiver and unobtainable from patient due to mental status. Per caregiver, no issues regarding bowel or bladder dysfunction, vaginal or breast complaints, or abnormal uterine bleeding.   Objective:   Ht 4\' 9"  (1.448 m)   LMP 08/19/2013   BMI 16.88 kg/m . Unable to get weight due to wheelchair bound. Patient uncooperative with BP cuff.   CONSTITUTIONAL: Disabled patient with microcephaly; well-nourished female.  PSYCHIATRIC: Mental retardation changes; response to requests for lying down with assistance from health care providers NEUROLGIC: Alert. Unable to assess orientation.  HENT:  Normocephalic, atraumatic, External right and left ear normal. Oropharynx is clear and moist. Poor dentition EYES: Conjunctivae and EOM are normal. Pupils are equal, round, and reactive to light. No scleral icterus.  NECK: Normal range of motion, supple, no masses.  Normal thyroid.  SKIN: Skin is warm and dry. No rash noted. Not diaphoretic. No erythema. No pallor. CARDIOVASCULAR:  Regular rhythm and rate, no murmur. RESPIRATORY: Clear to auscultation bilaterally. Effort and breath sounds normal, no problems with respiration noted. BREASTS: Symmetric in size. No masses, skin changes, nipple drainage, or lymphadenopathy. ABDOMEN: Rigid, voluntary guarding present, but non-tender.  Normal bowel sounds, no distention noted.   BLADDER: Normal PELVIC:  Bladder no bladder distension noted  Vulva: normal appearing vulva with no masses, tenderness or lesions  Vagina: mildly atrophic, no lesions present.   Cervix: Only partially visualized due to patient discomfort and limited tolerance of exam.   Uterus: Normal shape and size, mobile, non-tender.   Adnexa: No masses palpable, bilaterally.    RV: Not performed  MUSCULOSKELETAL: Mild retraction of the arms at rest (flexion at forearms). No tenderness.  No cyanosis, clubbing, or edema.   2+ distal pulses. LYMPHATIC: No Axillary, Supraclavicular, or Inguinal Adenopathy.   Labs: Performed by PCP at [facility].   Assessment:   Annual gynecologic examination 50 y.o. Contraception: postmenopausal status Normal BMI Microcephaly Seizure disorder Mental retardation  Plan:  - Pap: Last pap 2013. Attempted pap smear today. Patient has never been sexually active, no history of cervical cancer in self of family. Unless patient has pelvic complaints, vaginal exam can continue to be deferred due to mental status, intolerance to exams, and difficulty with pelvic exams in office.  - Mammogram: Ordered - Stool Guaiac Testing:  Discussed need for colon cancer screening. Caregiver notes that it is difficult to capture stool sample from patient.  Could consider blood testing if insurance covers. Otherwise, patient may be evaluated for possible colonoscopy. Can also consider yearly FOBT.  - Labs: None ordered. To be  performed by PCP.  - Routine preventative health maintenance measures emphasized with caregiver.  - Medical comorbidities (seizure disorder) managed by PCP.  - Return to Clinic - 1 Year   Hildred Laser, MD  [facility]

## 2020-11-11 ENCOUNTER — Encounter: Payer: Self-pay | Admitting: Obstetrics and Gynecology

## 2020-11-14 LAB — CYTOLOGY - PAP
Adequacy: ABSENT
Comment: NEGATIVE
Diagnosis: NEGATIVE
High risk HPV: NEGATIVE

## 2020-12-20 ENCOUNTER — Other Ambulatory Visit: Payer: Self-pay | Admitting: Internal Medicine

## 2020-12-20 DIAGNOSIS — Z1231 Encounter for screening mammogram for malignant neoplasm of breast: Secondary | ICD-10-CM

## 2021-01-24 ENCOUNTER — Other Ambulatory Visit: Payer: Self-pay

## 2021-01-24 ENCOUNTER — Ambulatory Visit
Admission: RE | Admit: 2021-01-24 | Discharge: 2021-01-24 | Disposition: A | Discharge status: Home / Self Care | Payer: Medicare Other | Source: Ambulatory Visit | Attending: Internal Medicine | Admitting: Internal Medicine

## 2021-01-24 DIAGNOSIS — Z1231 Encounter for screening mammogram for malignant neoplasm of breast: Secondary | ICD-10-CM

## 2021-11-27 ENCOUNTER — Other Ambulatory Visit: Payer: Self-pay

## 2021-11-27 ENCOUNTER — Ambulatory Visit (INDEPENDENT_AMBULATORY_CARE_PROVIDER_SITE_OTHER): Payer: Medicare Other | Admitting: Obstetrics and Gynecology

## 2021-11-27 ENCOUNTER — Encounter: Payer: Self-pay | Admitting: Obstetrics and Gynecology

## 2021-11-27 VITALS — BP 151/90 | HR 86 | Resp 16 | Ht <= 58 in | Wt 81.0 lb

## 2021-11-27 DIAGNOSIS — Z1231 Encounter for screening mammogram for malignant neoplasm of breast: Secondary | ICD-10-CM | POA: Diagnosis not present

## 2021-11-27 DIAGNOSIS — Z1211 Encounter for screening for malignant neoplasm of colon: Secondary | ICD-10-CM | POA: Diagnosis not present

## 2021-11-27 DIAGNOSIS — Z0000 Encounter for general adult medical examination without abnormal findings: Secondary | ICD-10-CM | POA: Diagnosis not present

## 2021-11-27 DIAGNOSIS — Z01419 Encounter for gynecological examination (general) (routine) without abnormal findings: Secondary | ICD-10-CM | POA: Diagnosis not present

## 2021-11-27 MED ORDER — PEG 3350-KCL-NA BICARB-NACL 420 G PO SOLR: 4000.0000 mL | Freq: Once | ORAL | 0 refills | Status: AC

## 2021-11-27 NOTE — Progress Notes (Signed)
? ?ANNUAL PREVENTATIVE CARE GYNECOLOGY  ENCOUNTER NOTE ? ?Subjective:  ?    ? Joann Mcconnell is a 51 y.o. G0P0000 female here for a routine annual gynecologic exam. She has a medical history significant for microcephaly, seizure disorder, mental retardation. Resides in a group home. She is also a [facility]. The patient has never been sexually active. The patient has never been on hormone replacement therapy. Patient has no history post-menopausal vaginal bleeding.  Has the patient ever been transfused or tattooed?: no.  ?  ? ?Current complaints: ?1.  None, per caregiver.   ?  ?Gynecologic History ?Patient's last menstrual period was 08/19/2013. ?Contraception: post menopausal status ?Last Pap: 11/10/2020. Results were: normal ?Last mammogram: 01/24/2021. Results were: normal ?Last Colonoscopy: Never done ?Last Dexa Scan: Never done ? ? ?Obstetric History ?OB History  ?Gravida Para Term Preterm AB Living  ?0 0 0 0 0 0  ?SAB IAB Ectopic Multiple Live Births  ?0 0 0 0    ? ? ?Past Medical History:  ?Diagnosis Date  ? Anxiety   ? Cataract   ? Diabetes mellitus without complication (HCC)   ? Hyperlipemia   ? Microcephaly (HCC)   ? MR (mental retardation)   ? Seizure disorder (HCC)   ? ? ?Family History  ?Problem Relation Age of Onset  ? Breast cancer Neg Hx   ? ? ?Past Surgical History:  ?Procedure Laterality Date  ? IRRIGATION AND DEBRIDEMENT FOOT Right 11/10/2018  ? Procedure: IRRIGATION AND DEBRIDEMENT FOOT;  Surgeon: Linus Galasline, Todd, DPM;  Location: [facility] ORS;  Service: Podiatry;  Laterality: Right;  ? NO PAST SURGERIES    ? ? ?Social History  ? ?Socioeconomic History  ? Marital status: Single  ?  Spouse name: Not on file  ? Number of children: Not on file  ? Years of education: Not on file  ? Highest education level: Not on file  ?Occupational History  ? Not on file  ?Tobacco Use  ? Smoking status: Never  ? Smokeless tobacco: Never  ?Vaping Use  ? Vaping Use: Never used  ?Substance and Sexual  Activity  ? Alcohol use: No  ? Drug use: No  ? Sexual activity: Never  ?Other Topics Concern  ? Not on file  ?Social History Narrative  ? Not on file  ? ?Social Determinants of Health  ? ?Financial Resource Strain: Not on file  ?Food Insecurity: Not on file  ?Transportation Needs: Not on file  ?Physical Activity: Not on file  ?Stress: Not on file  ?Social Connections: Not on file  ?Intimate Partner Violence: Not on file  ? ? ?Current Outpatient Medications on File Prior to Visit  ?Medication Sig Dispense Refill  ? cetirizine (ZYRTEC) 10 MG tablet Take 10 mg by mouth daily.    ? Cholecalciferol (VITAMIN D3) 50 MCG (2000 UT) TABS Take by mouth.    ? citalopram (CELEXA) 10 MG tablet Take 10 mg by mouth daily.    ? docusate sodium (COLACE) 100 MG capsule Take 100-200 mg by mouth 2 (two) times daily. Take 1 in the morning, and 2 at bedtime    ? glimepiride (AMARYL) 2 MG tablet Take 2 mg by mouth daily with breakfast.    ? insulin glargine (LANTUS) 100 UNIT/ML Solostar Pen INJECT 9 UNITS SQ ONCE DAILY AT BEDTIME    ? LORazepam (ATIVAN) 2 MG tablet Take 2 mg by mouth daily as needed. Take 1 hour prior to to medical procedures    ?  losartan (COZAAR) 25 MG tablet TAKE 1 TABLET BY MOUTH ONCE DAILY FOR BP    ? pravastatin (PRAVACHOL) 80 MG tablet Take 80 mg by mouth daily.    ? tolnaftate (TINACTIN) 1 % powder Apply 1 application topically 2 (two) times daily.    ? traZODone (DESYREL) 50 MG tablet Take 50 mg by mouth at bedtime.    ? zinc sulfate 220 (50 Zn) MG capsule Take 220 mg by mouth daily.    ? ?No current facility-administered medications on file prior to visit.  ? ? ?No Known Allergies ? ? ? ?Review of Systems ?ROS - Negative per caregiver ?Review of Systems - General ROS: negative for - chills, fatigue, fever, hot flashes, night sweats, weight gain or weight loss ?Psychological ROS: negative for - anxiety, decreased libido, depression, mood swings, physical abuse or sexual abuse ?Ophthalmic ROS: negative for - blurry  vision, eye pain or loss of vision ?ENT ROS: negative for - headaches, hearing change, visual changes or vocal changes ?Allergy and Immunology ROS: negative for - hives, itchy/watery eyes or seasonal allergies ?Hematological and Lymphatic ROS: negative for - bleeding problems, bruising, swollen lymph nodes or weight loss ?Endocrine ROS: negative for - galactorrhea, hair pattern changes, hot flashes, malaise/lethargy, mood swings, palpitations, polydipsia/polyuria, skin changes, temperature intolerance or unexpected weight changes ?Breast ROS: negative for - new or changing breast lumps or nipple discharge ?Respiratory ROS: negative for - cough or shortness of breath ?Cardiovascular ROS: negative for - chest pain, irregular heartbeat, palpitations or shortness of breath ?Gastrointestinal ROS: no abdominal pain, change in bowel habits, or black or bloody stools ?Genito-Urinary ROS: no dysuria, trouble voiding, or hematuria ?Musculoskeletal ROS: negative for - joint pain or joint stiffness ?Neurological ROS: negative for - bowel and bladder control changes ?Dermatological ROS: negative for rash and skin lesion changes ?  ?Objective:  ? ?LMP 08/19/2013  ?CONSTITUTIONAL: Well-developed, well-nourished female in no acute distress.  ?PSYCHIATRIC: Normal mood and affect. Normal behavior. Normal judgment and thought content. ?NEUROLGIC: Alert and oriented to person, place, and time. Normal muscle tone coordination. No cranial nerve deficit noted. ?HENT:  Normocephalic, atraumatic, External right and left ear normal. Oropharynx is clear and moist ?EYES: Conjunctivae and EOM are normal. Pupils are equal, round, and reactive to light. No scleral icterus.  ?NECK: Normal range of motion, supple, no masses.  Normal thyroid.  ?SKIN: Skin is warm and dry. No rash noted. Not diaphoretic. No erythema. No pallor. ?CARDIOVASCULAR: Normal heart rate noted, regular rhythm, no murmur. ?RESPIRATORY: Clear to auscultation bilaterally. Effort  and breath sounds normal, no problems with respiration noted. ?BREASTS: Symmetric in size. No masses, skin changes, nipple drainage, or lymphadenopathy. ?ABDOMEN: Soft, normal bowel sounds, no distention noted.  No tenderness, rebound or guarding.  ?BLADDER: Normal ?PELVIC: Deferred due to patient's mental status and lack of cooperation (combative) during exams.  ?LYMPHATIC: No Axillary, Supraclavicular, or Inguinal Adenopathy. ? ? ?Labs: ?Lab Results  ?Component Value Date  ? WBC 10.7 (H) 11/11/2018  ? HGB 11.5 (L) 11/11/2018  ? HCT 35.0 (L) 11/11/2018  ? MCV 105.4 (H) 11/11/2018  ? PLT 221 11/11/2018  ? ? ?Lbs performed by PCP. ? ? ?Assessment:  ? ?Annual gynecologic examination 51 y.o. ?Contraception: postmenopausal status ?Normal BMI ?Microcephaly ?Seizure disorder ?Mental retardation ? ?Plan:  ?Pap: Not needed, up to date. Per ASCCP guidelines, screening can be performed q 5 years due to low risk of exposure. Next due in 2027.  ?Mammogram:  Up to date. Continue routine screening.  ?  Colon screening:   Colon screening ordered.  ?Labs: Per PCP ?Routine preventative health maintenance measures emphasized: Exercise/Diet/Weight control, Tobacco Warnings, Alcohol/Substance use risks, Stress Management, Peer Pressure Issues, and Safe Sex ?COVID Vaccination status: up to date.  ?Return to Clinic - 1 Year ? ? ?Hildred Laser, MD ?[facility]  ? ? ? ? ? ? ? ? ? ? ? ? ?  ?

## 2021-11-27 NOTE — Progress Notes (Signed)
Gastroenterology Pre-Procedure Review ? ?Request Date: 01/04/2022 ?Requesting Physician: Dr. Allegra Lai ? ?PATIENT REVIEW QUESTIONS: The patient responded to the following health history questions as indicated:   ? ?1. Are you having any GI issues? no ?2. Do you have a personal history of Polyps? no ?3. Do you have a family history of Colon Cancer or Polyps? no ?4. Diabetes Mellitus? yes (TYPE 2) ?5. Joint replacements in the past 12 months?no ?6. Major health problems in the past 3 months?no ?7. Any artificial heart valves, MVP, or defibrillator?no ?   ?MEDICATIONS & ALLERGIES:    ?Patient reports the following regarding taking any anticoagulation/antiplatelet therapy:   ?Plavix, Coumadin, Eliquis, Xarelto, Lovenox, Pradaxa, Brilinta, or Effient? no ?Aspirin? no ? ?Patient confirms/reports the following medications:  ?Current Outpatient Medications  ?Medication Sig Dispense Refill  ? cetirizine (ZYRTEC) 10 MG tablet Take 10 mg by mouth daily.    ? Cholecalciferol (VITAMIN D3) 50 MCG (2000 UT) TABS Take by mouth.    ? citalopram (CELEXA) 20 MG tablet TAKE 1.5 TABLETS BY MOUTH IN THE MORNINGFOR DEPRESSION    ? docusate sodium (COLACE) 100 MG capsule Take 100-200 mg by mouth 2 (two) times daily. Take 1 in the morning, and 2 at bedtime    ? glimepiride (AMARYL) 2 MG tablet Take 1 tablet by mouth daily.    ? insulin glargine (LANTUS SOLOSTAR) 100 UNIT/ML Solostar Pen Inject into the skin.    ? LORazepam (ATIVAN) 2 MG tablet Take 2 mg by mouth daily as needed. Take 1 hour prior to to medical procedures    ? losartan (COZAAR) 25 MG tablet TAKE 1 TABLET BY MOUTH ONCE DAILY FOR BP    ? pravastatin (PRAVACHOL) 80 MG tablet Take 80 mg by mouth daily.    ? tolnaftate (TINACTIN) 1 % powder Apply 1 application topically 2 (two) times daily.    ? traZODone (DESYREL) 50 MG tablet Take 50 mg by mouth at bedtime.    ? zinc sulfate 220 (50 Zn) MG capsule Take 220 mg by mouth daily.    ? zinc sulfate 220 (50 Zn) MG capsule Take 1 capsule  by mouth daily.    ? ?No current facility-administered medications for this visit.  ? ? ?Patient confirms/reports the following allergies:  ?No Known Allergies ? ?No orders of the defined types were placed in this encounter. ? ? ?AUTHORIZATION INFORMATION ?Primary Insurance: ?1D#: ?Group #: ? ?Secondary Insurance: ?1D#: ?Group #: ? ?SCHEDULE INFORMATION: ?Date: 01/04/2022 ?Time: ?Location:[facility] ? ?

## 2021-12-26 ENCOUNTER — Other Ambulatory Visit: Payer: Self-pay | Admitting: Obstetrics and Gynecology

## 2021-12-26 DIAGNOSIS — Z1231 Encounter for screening mammogram for malignant neoplasm of breast: Secondary | ICD-10-CM

## 2022-01-02 ENCOUNTER — Telehealth: Payer: Self-pay | Admitting: Obstetrics and Gynecology

## 2022-01-02 NOTE — Telephone Encounter (Signed)
Care team states " they are having a hard time getting her to drink anything". They are concerned she will not get enough RX in her for the colonoscopy. They are asking if there is anything else they can do. Please return call . ?

## 2022-01-02 NOTE — Telephone Encounter (Signed)
Called back. Told her to call GI and see if there was another option. I know she could do pills. She verbalized understanding. ?

## 2022-01-03 ENCOUNTER — Telehealth: Payer: Self-pay | Admitting: Gastroenterology

## 2022-01-03 ENCOUNTER — Telehealth: Payer: Self-pay | Admitting: Obstetrics and Gynecology

## 2022-01-03 NOTE — Telephone Encounter (Signed)
Pt has question about upcomimg procedure

## 2022-01-03 NOTE — Telephone Encounter (Signed)
Eileen Stanford called to inform Dr Valentino Saxon she had to cancel her colonoscopy. Was unable to get Mrs. Gautier to drink the RX  . Mrs. Walker wanted to inform you

## 2022-01-04 ENCOUNTER — Ambulatory Visit: Admission: RE | Admit: 2022-01-04 | Payer: Medicare Other | Source: Home / Self Care | Admitting: Gastroenterology

## 2022-01-04 ENCOUNTER — Telehealth: Payer: Self-pay | Admitting: Obstetrics and Gynecology

## 2022-01-04 ENCOUNTER — Encounter: Admission: RE | Payer: Self-pay | Source: Home / Self Care

## 2022-01-04 DIAGNOSIS — Z1211 Encounter for screening for malignant neoplasm of colon: Secondary | ICD-10-CM

## 2022-01-04 SURGERY — COLONOSCOPY WITH PROPOFOL
Anesthesia: General

## 2022-01-04 NOTE — Telephone Encounter (Signed)
Only other one would be to do yearly stool cards at her visits with either her PCP or here.  However that can be a little difficult to manage as she can be combative during exams.

## 2022-01-04 NOTE — Telephone Encounter (Signed)
Pt's mother called this morning stating that her daughter is in a group home and was suppose to have a colonoscopy with GI - but was unable to take meds- she is asking to make Dr.Cherry aware as she is wondering what next step would be- such as a stool samples. Pt's mother requested that we reach out to Faye W. As she is patient's caregiver and is like a second mother to pt. Please advise.

## 2022-01-04 NOTE — Telephone Encounter (Signed)
I called Mrs  Joann Mcconnell,  she is having the documentation faxed from [facility]. Mrs. Joann Mcconnell resides there. This documentation gives Mrs. Joann Mcconnell permission to be apart of her health care management. That way we have it on file here at [facility].

## 2022-01-07 NOTE — Addendum Note (Signed)
Addended by: Chilton Greathouse on: 01/07/2022 01:24 PM   Modules accepted: Orders

## 2022-01-07 NOTE — Telephone Encounter (Signed)
Called Joann Mcconnell she said, she thinks she can do the cologuard. I told her that I would get the kit mailed out to her.

## 2022-01-28 ENCOUNTER — Ambulatory Visit
Admission: RE | Admit: 2022-01-28 | Discharge: 2022-01-28 | Disposition: A | Discharge status: Home / Self Care | Payer: Medicare Other | Source: Ambulatory Visit | Attending: Obstetrics and Gynecology | Admitting: Obstetrics and Gynecology

## 2022-01-28 DIAGNOSIS — Z1231 Encounter for screening mammogram for malignant neoplasm of breast: Secondary | ICD-10-CM | POA: Insufficient documentation

## 2022-01-28 LAB — COLOGUARD: COLOGUARD: NEGATIVE

## 2022-11-29 ENCOUNTER — Encounter: Payer: Medicare Other | Admitting: Obstetrics and Gynecology

## 2023-01-03 ENCOUNTER — Other Ambulatory Visit: Payer: Self-pay | Admitting: Obstetrics and Gynecology

## 2023-01-03 DIAGNOSIS — Z1231 Encounter for screening mammogram for malignant neoplasm of breast: Secondary | ICD-10-CM

## 2023-01-08 ENCOUNTER — Ambulatory Visit: Payer: Medicare Other | Admitting: Obstetrics and Gynecology

## 2023-01-15 ENCOUNTER — Ambulatory Visit: Payer: Medicare Other | Admitting: Obstetrics and Gynecology

## 2023-01-21 NOTE — Progress Notes (Deleted)
ANNUAL PREVENTATIVE CARE GYNECOLOGY  ENCOUNTER NOTE  Subjective:       Joann Mcconnell is a 52 y.o. G0P0000 female here for a routine annual gynecologic exam.  She has a medical history significant for microcephaly, seizure disorder, mental retardation. Resides in a group home. She is also a patient at the [facility]. The patient has never been sexually active. The patient has never been on hormone replacement therapy. Patient has no history post-menopausal vaginal bleeding.  Has the patient ever been transfused or tattooed?: no.   Current complaints: 1.  ***    Gynecologic History Patient's last menstrual period was 08/19/2013. Contraception: post menopausal status Last Pap: 01/28/2022. Results were: normal Last mammogram: 02/05/2023. Results were: normal Last Cologuard: 01/21/2022: 3 years Last Dexa Scan: Not age appropriate   Obstetric History OB History  Gravida Para Term Preterm AB Living  0 0 0 0 0 0  SAB IAB Ectopic Multiple Live Births  0 0 0 0      Past Medical History:  Diagnosis Date   Anxiety    Cataract    Diabetes mellitus without complication (HCC)    Hyperlipemia    Microcephaly (HCC)    MR (mental retardation)    Seizure disorder (HCC)     Family History  Problem Relation Age of Onset   Breast cancer Neg Hx     Past Surgical History:  Procedure Laterality Date   IRRIGATION AND DEBRIDEMENT FOOT Right 11/10/2018   Procedure: IRRIGATION AND DEBRIDEMENT FOOT;  Surgeon: Linus Galas, DPM;  Location: [facility] ORS;  Service: Podiatry;  Laterality: Right;   NO PAST SURGERIES      Social History   Socioeconomic History   Marital status: Single    Spouse name: Not on file   Number of children: Not on file   Years of education: Not on file   Highest education level: Not on file  Occupational History   Not on file  Tobacco Use   Smoking status: Never   Smokeless tobacco: Never  Vaping Use   Vaping Use: Never used  Substance and Sexual  Activity   Alcohol use: No   Drug use: No   Sexual activity: Never  Other Topics Concern   Not on file  Social History Narrative   Not on file   Social Determinants of Health   Financial Resource Strain: Not on file  Food Insecurity: Not on file  Transportation Needs: Not on file  Physical Activity: Not on file  Stress: Not on file  Social Connections: Not on file  Intimate Partner Violence: Not on file    Current Outpatient Medications on File Prior to Visit  Medication Sig Dispense Refill   cetirizine (ZYRTEC) 10 MG tablet Take 10 mg by mouth daily.     Cholecalciferol (VITAMIN D3) 50 MCG (2000 UT) TABS Take by mouth.     Cholecalciferol 50 MCG (2000 UT) CAPS TAKE 1 SOFTGEL BY MOUTH ONCE DAILY     citalopram (CELEXA) 20 MG tablet TAKE 1.5 TABLETS BY MOUTH IN THE MORNINGFOR DEPRESSION     docusate sodium (COLACE) 100 MG capsule Take 100-200 mg by mouth 2 (two) times daily. Take 1 in the morning, and 2 at bedtime     glimepiride (AMARYL) 2 MG tablet Take 1 tablet by mouth daily.     glucose blood test strip TAKE FINGER STICK BLOOD SUGAR TEST ONCE DAILY     insulin glargine (LANTUS SOLOSTAR) 100 UNIT/ML Solostar Pen Inject into  the skin.     Insulin Pen Needle (ULTRACARE PEN NEEDLES) 31G X 5 MM MISC USE WITH LANTUS DAILY     LORazepam (ATIVAN) 2 MG tablet Take 2 mg by mouth daily as needed. Take 1 hour prior to to medical procedures     losartan (COZAAR) 25 MG tablet TAKE 1 TABLET BY MOUTH ONCE DAILY FOR BP     mirtazapine (REMERON) 7.5 MG tablet Take 7.5 mg by mouth at bedtime.     pravastatin (PRAVACHOL) 80 MG tablet Take 80 mg by mouth daily.     tolnaftate (TINACTIN) 1 % powder Apply 1 application topically 2 (two) times daily.     traZODone (DESYREL) 50 MG tablet Take 50 mg by mouth at bedtime.     zinc sulfate 220 (50 Zn) MG capsule Take 220 mg by mouth daily.     zinc sulfate 220 (50 Zn) MG capsule Take 1 capsule by mouth daily.     No current facility-administered  medications on file prior to visit.    No Known Allergies    Review of Systems ROS Review of Systems - General ROS: negative for - chills, fatigue, fever, hot flashes, night sweats, weight gain or weight loss Psychological ROS: negative for - anxiety, decreased libido, depression, mood swings, physical abuse or sexual abuse Ophthalmic ROS: negative for - blurry vision, eye pain or loss of vision ENT ROS: negative for - headaches, hearing change, visual changes or vocal changes Allergy and Immunology ROS: negative for - hives, itchy/watery eyes or seasonal allergies Hematological and Lymphatic ROS: negative for - bleeding problems, bruising, swollen lymph nodes or weight loss Endocrine ROS: negative for - galactorrhea, hair pattern changes, hot flashes, malaise/lethargy, mood swings, palpitations, polydipsia/polyuria, skin changes, temperature intolerance or unexpected weight changes Breast ROS: negative for - new or changing breast lumps or nipple discharge Respiratory ROS: negative for - cough or shortness of breath Cardiovascular ROS: negative for - chest pain, irregular heartbeat, palpitations or shortness of breath Gastrointestinal ROS: no abdominal pain, change in bowel habits, or black or bloody stools Genito-Urinary ROS: no dysuria, trouble voiding, or hematuria Musculoskeletal ROS: negative for - joint pain or joint stiffness Neurological ROS: negative for - bowel and bladder control changes Dermatological ROS: negative for rash and skin lesion changes   Objective:   LMP 08/19/2013  CONSTITUTIONAL: Well-developed, well-nourished female in no acute distress.  PSYCHIATRIC: Normal mood and affect. Normal behavior. Normal judgment and thought content. NEUROLGIC: Alert and oriented to person, place, and time. Normal muscle tone coordination. No cranial nerve deficit noted. HENT:  Normocephalic, atraumatic, External right and left ear normal. Oropharynx is clear and moist EYES:  Conjunctivae and EOM are normal. Pupils are equal, round, and reactive to light. No scleral icterus.  NECK: Normal range of motion, supple, no masses.  Normal thyroid.  SKIN: Skin is warm and dry. No rash noted. Not diaphoretic. No erythema. No pallor. CARDIOVASCULAR: Normal heart rate noted, regular rhythm, no murmur. RESPIRATORY: Clear to auscultation bilaterally. Effort and breath sounds normal, no problems with respiration noted. BREASTS: Symmetric in size. No masses, skin changes, nipple drainage, or lymphadenopathy. ABDOMEN: Soft, normal bowel sounds, no distention noted.  No tenderness, rebound or guarding.  BLADDER: Normal PELVIC:  Bladder {:311640}  Urethra: {:311719}  Vulva: {:311722}  Vagina: {:311643}  Cervix: {:311644}  Uterus: {:311718}  Adnexa: {:311645}  RV: {Blank multiple:19196::"External Exam NormaI","No Rectal Masses","Normal Sphincter tone"}  MUSCULOSKELETAL: Normal range of motion. No tenderness.  No cyanosis, clubbing, or edema.  2+ distal pulses. LYMPHATIC: No Axillary, Supraclavicular, or Inguinal Adenopathy.   Labs: Lab Results  Component Value Date   WBC 10.7 (H) 11/11/2018   HGB 11.5 (L) 11/11/2018   HCT 35.0 (L) 11/11/2018   MCV 105.4 (H) 11/11/2018   PLT 221 11/11/2018    Lab Results  Component Value Date   CREATININE 0.61 11/12/2018   BUN 12 11/10/2018   NA 143 11/10/2018   K 4.0 11/12/2018   CL 106 11/10/2018   CO2 28 11/10/2018    No results found for: "ALT", "AST", "GGT", "ALKPHOS", "BILITOT"  No results found for: "CHOL", "HDL", "LDLCALC", "LDLDIRECT", "TRIG", "CHOLHDL"  No results found for: "TSH"  No results found for: "HGBA1C"   Assessment:   1. Encounter for well woman exam with routine gynecological exam   2. HTN (hypertension), benign   3. Type 2 diabetes mellitus with stage 2 chronic kidney disease, with long-term current use of insulin (HCC)   4. Hyperlipidemia, unspecified hyperlipidemia type   5. Need for hepatitis C  screening test      Plan:  Pap:  UTD Mammogram:  Scheduled Colon Screening:   Cologuard: UTD : 3 years Labs:  Ordered Routine preventative health maintenance measures emphasized: Exercise/Diet/Weight control COVID Vaccination status: Return to Clinic - 1 Year   Hildred Laser, MD  [facility] [place]

## 2023-01-22 ENCOUNTER — Ambulatory Visit: Payer: Medicare Other | Admitting: Obstetrics and Gynecology

## 2023-01-22 DIAGNOSIS — Z1159 Encounter for screening for other viral diseases: Secondary | ICD-10-CM

## 2023-01-22 DIAGNOSIS — I10 Essential (primary) hypertension: Secondary | ICD-10-CM

## 2023-01-22 DIAGNOSIS — N182 Chronic kidney disease, stage 2 (mild): Secondary | ICD-10-CM

## 2023-01-22 DIAGNOSIS — E785 Hyperlipidemia, unspecified: Secondary | ICD-10-CM

## 2023-01-22 DIAGNOSIS — Z01419 Encounter for gynecological examination (general) (routine) without abnormal findings: Secondary | ICD-10-CM

## 2023-02-14 ENCOUNTER — Ambulatory Visit
Admission: RE | Admit: 2023-02-14 | Discharge: 2023-02-14 | Disposition: A | Discharge status: Home / Self Care | Payer: Medicare Other | Source: Ambulatory Visit | Attending: Obstetrics and Gynecology | Admitting: Obstetrics and Gynecology

## 2023-02-14 DIAGNOSIS — Z1231 Encounter for screening mammogram for malignant neoplasm of breast: Secondary | ICD-10-CM | POA: Insufficient documentation

## 2023-02-26 ENCOUNTER — Ambulatory Visit: Payer: Medicare Other | Admitting: Obstetrics and Gynecology

## 2023-03-17 NOTE — Progress Notes (Deleted)
ANNUAL PREVENTATIVE CARE GYNECOLOGY  ENCOUNTER NOTE  Subjective:       Joann Mcconnell is a 52 y.o. G0P0000 female here for a routine annual gynecologic exam. She has a medical history significant for microcephaly, seizure disorder, mental retardation. Resides in a group home. She is also a patient at the [facility]. The patient has never been sexually active. The patient has never been on hormone replacement therapy. Patient has no history post-menopausal vaginal bleeding.  Has the patient ever been transfused or tattooed?: no.    Current complaints: 1.  ***    Gynecologic History Patient's last menstrual period was 08/19/2013. Contraception: post menopausal status Last Pap: 11/10/2020. Results were: normal Last mammogram: 01/28/2022. Results were: normal Last ColoGUARD: 01/21/2022: Negative Last Dexa Scan: Never Done   Obstetric History OB History  Gravida Para Term Preterm AB Living  0 0 0 0 0 0  SAB IAB Ectopic Multiple Live Births  0 0 0 0      Past Medical History:  Diagnosis Date   Anxiety    Cataract    Diabetes mellitus without complication (HCC)    Hyperlipemia    Microcephaly (HCC)    MR (mental retardation)    Seizure disorder (HCC)     Family History  Problem Relation Age of Onset   Breast cancer Neg Hx     Past Surgical History:  Procedure Laterality Date   IRRIGATION AND DEBRIDEMENT FOOT Right 11/10/2018   Procedure: IRRIGATION AND DEBRIDEMENT FOOT;  Surgeon: Linus Galas, DPM;  Location: [facility] ORS;  Service: Podiatry;  Laterality: Right;   NO PAST SURGERIES      Social History   Socioeconomic History   Marital status: Single    Spouse name: Not on file   Number of children: Not on file   Years of education: Not on file   Highest education level: Not on file  Occupational History   Not on file  Tobacco Use   Smoking status: Never   Smokeless tobacco: Never  Vaping Use   Vaping status: Never Used  Substance and Sexual Activity    Alcohol use: No   Drug use: No   Sexual activity: Never  Other Topics Concern   Not on file  Social History Narrative   Not on file   Social Determinants of Health   Financial Resource Strain: Not on file  Food Insecurity: Not on file  Transportation Needs: Not on file  Physical Activity: Not on file  Stress: Not on file  Social Connections: Not on file  Intimate Partner Violence: Not on file    Current Outpatient Medications on File Prior to Visit  Medication Sig Dispense Refill   cetirizine (ZYRTEC) 10 MG tablet Take 10 mg by mouth daily.     Cholecalciferol (VITAMIN D3) 50 MCG (2000 UT) TABS Take by mouth.     Cholecalciferol 50 MCG (2000 UT) CAPS TAKE 1 SOFTGEL BY MOUTH ONCE DAILY     citalopram (CELEXA) 20 MG tablet TAKE 1.5 TABLETS BY MOUTH IN THE MORNINGFOR DEPRESSION     docusate sodium (COLACE) 100 MG capsule Take 100-200 mg by mouth 2 (two) times daily. Take 1 in the morning, and 2 at bedtime     glimepiride (AMARYL) 2 MG tablet Take 1 tablet by mouth daily.     glucose blood test strip TAKE FINGER STICK BLOOD SUGAR TEST ONCE DAILY     insulin glargine (LANTUS SOLOSTAR) 100 UNIT/ML Solostar Pen Inject into the skin.  Insulin Pen Needle (ULTRACARE PEN NEEDLES) 31G X 5 MM MISC USE WITH LANTUS DAILY     LORazepam (ATIVAN) 2 MG tablet Take 2 mg by mouth daily as needed. Take 1 hour prior to to medical procedures     losartan (COZAAR) 25 MG tablet TAKE 1 TABLET BY MOUTH ONCE DAILY FOR BP     mirtazapine (REMERON) 7.5 MG tablet Take 7.5 mg by mouth at bedtime.     pravastatin (PRAVACHOL) 80 MG tablet Take 80 mg by mouth daily.     tolnaftate (TINACTIN) 1 % powder Apply 1 application topically 2 (two) times daily.     traZODone (DESYREL) 50 MG tablet Take 50 mg by mouth at bedtime.     zinc sulfate 220 (50 Zn) MG capsule Take 220 mg by mouth daily.     zinc sulfate 220 (50 Zn) MG capsule Take 1 capsule by mouth daily.     No current facility-administered medications on  file prior to visit.    No Known Allergies    Review of Systems ROS Review of Systems - General ROS: negative for - chills, fatigue, fever, hot flashes, night sweats, weight gain or weight loss Psychological ROS: negative for - anxiety, decreased libido, depression, mood swings, physical abuse or sexual abuse Ophthalmic ROS: negative for - blurry vision, eye pain or loss of vision ENT ROS: negative for - headaches, hearing change, visual changes or vocal changes Allergy and Immunology ROS: negative for - hives, itchy/watery eyes or seasonal allergies Hematological and Lymphatic ROS: negative for - bleeding problems, bruising, swollen lymph nodes or weight loss Endocrine ROS: negative for - galactorrhea, hair pattern changes, hot flashes, malaise/lethargy, mood swings, palpitations, polydipsia/polyuria, skin changes, temperature intolerance or unexpected weight changes Breast ROS: negative for - new or changing breast lumps or nipple discharge Respiratory ROS: negative for - cough or shortness of breath Cardiovascular ROS: negative for - chest pain, irregular heartbeat, palpitations or shortness of breath Gastrointestinal ROS: no abdominal pain, change in bowel habits, or black or bloody stools Genito-Urinary ROS: no dysuria, trouble voiding, or hematuria Musculoskeletal ROS: negative for - joint pain or joint stiffness Neurological ROS: negative for - bowel and bladder control changes Dermatological ROS: negative for rash and skin lesion changes   Objective:   LMP 08/19/2013  CONSTITUTIONAL: Well-developed, well-nourished female in no acute distress.  PSYCHIATRIC: Normal mood and affect. Normal behavior. Normal judgment and thought content. NEUROLGIC: Alert and oriented to person, place, and time. Normal muscle tone coordination. No cranial nerve deficit noted. HENT:  Normocephalic, atraumatic, External right and left ear normal. Oropharynx is clear and moist EYES: Conjunctivae and  EOM are normal. Pupils are equal, round, and reactive to light. No scleral icterus.  NECK: Normal range of motion, supple, no masses.  Normal thyroid.  SKIN: Skin is warm and dry. No rash noted. Not diaphoretic. No erythema. No pallor. CARDIOVASCULAR: Normal heart rate noted, regular rhythm, no murmur. RESPIRATORY: Clear to auscultation bilaterally. Effort and breath sounds normal, no problems with respiration noted. BREASTS: Symmetric in size. No masses, skin changes, nipple drainage, or lymphadenopathy. ABDOMEN: Soft, normal bowel sounds, no distention noted.  No tenderness, rebound or guarding.  BLADDER: Normal PELVIC:  Bladder {:311640}  Urethra: {:311719}  Vulva: {:311722}  Vagina: {:311643}  Cervix: {:311644}  Uterus: {:311718}  Adnexa: {:311645}  RV: {Blank multiple:19196::"External Exam NormaI","No Rectal Masses","Normal Sphincter tone"}  MUSCULOSKELETAL: Normal range of motion. No tenderness.  No cyanosis, clubbing, or edema.  2+ distal pulses. LYMPHATIC: No  Axillary, Supraclavicular, or Inguinal Adenopathy.   Labs: Lab Results  Component Value Date   WBC 10.7 (H) 11/11/2018   HGB 11.5 (L) 11/11/2018   HCT 35.0 (L) 11/11/2018   MCV 105.4 (H) 11/11/2018   PLT 221 11/11/2018    Lab Results  Component Value Date   CREATININE 0.61 11/12/2018   BUN 12 11/10/2018   NA 143 11/10/2018   K 4.0 11/12/2018   CL 106 11/10/2018   CO2 28 11/10/2018    No results found for: "ALT", "AST", "GGT", "ALKPHOS", "BILITOT"  No results found for: "CHOL", "HDL", "LDLCALC", "LDLDIRECT", "TRIG", "CHOLHDL"  No results found for: "TSH"  No results found for: "HGBA1C"   Assessment:   No diagnosis found.   Plan:  Pap: {Blank multiple:19196::"Pap, Reflex if ASCUS","Pap Co Test","GC/CT NAAT","Not needed","Not done"} Mammogram: {Blank multiple:19196::"***","Ordered","Not Ordered","Not Indicated"} Colon Screening:  {Blank multiple:19196::"***","Ordered","Not Ordered","Not  Indicated"} Labs: {Blank multiple:19196::"Lipid 1","FBS","TSH","Hemoglobin A1C","Vit D Level""***"} Routine preventative health maintenance measures emphasized: {Blank multiple:19196::"Exercise/Diet/Weight control","Tobacco Warnings","Alcohol/Substance use risks","Stress Management","Peer Pressure Issues","Safe Sex"} COVID Vaccination status: Return to Clinic - 1 Year   [employer], [place] [facility]

## 2023-03-18 ENCOUNTER — Ambulatory Visit: Payer: Medicare Other | Admitting: Obstetrics and Gynecology

## 2023-03-18 DIAGNOSIS — Z01419 Encounter for gynecological examination (general) (routine) without abnormal findings: Secondary | ICD-10-CM

## 2023-03-18 DIAGNOSIS — Z1231 Encounter for screening mammogram for malignant neoplasm of breast: Secondary | ICD-10-CM

## 2023-03-18 DIAGNOSIS — Z131 Encounter for screening for diabetes mellitus: Secondary | ICD-10-CM

## 2023-03-18 DIAGNOSIS — Z13220 Encounter for screening for lipoid disorders: Secondary | ICD-10-CM
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
## 2023-04-09 ENCOUNTER — Ambulatory Visit (INDEPENDENT_AMBULATORY_CARE_PROVIDER_SITE_OTHER): Payer: Medicare Other | Admitting: Obstetrics and Gynecology

## 2023-04-09 ENCOUNTER — Encounter: Payer: Self-pay | Admitting: Obstetrics and Gynecology

## 2023-04-09 VITALS — BP 142/88 | HR 108 | Resp 16 | Ht <= 58 in | Wt 79.8 lb

## 2023-04-09 DIAGNOSIS — F79 Unspecified intellectual disabilities: Secondary | ICD-10-CM

## 2023-04-09 DIAGNOSIS — Z01419 Encounter for gynecological examination (general) (routine) without abnormal findings: Secondary | ICD-10-CM

## 2023-04-09 DIAGNOSIS — Z1211 Encounter for screening for malignant neoplasm of colon: Secondary | ICD-10-CM

## 2023-04-09 DIAGNOSIS — Z1231 Encounter for screening mammogram for malignant neoplasm of breast: Secondary | ICD-10-CM

## 2023-04-09 DIAGNOSIS — G40909 Epilepsy, unspecified, not intractable, without status epilepticus: Secondary | ICD-10-CM

## 2023-04-09 DIAGNOSIS — Q02 Microcephaly: Secondary | ICD-10-CM

## 2023-04-20 LAB — FECAL OCCULT BLOOD, IMMUNOCHEMICAL: Fecal Occult Bld: NEGATIVE

## 2023-04-25 ENCOUNTER — Emergency Department
Admission: EM | Admit: 2023-04-25 | Discharge: 2023-04-27 | Disposition: A | Discharge status: Home / Self Care | Payer: Medicare Other | Attending: Emergency Medicine | Admitting: Emergency Medicine

## 2023-04-25 DIAGNOSIS — W19XXXA Unspecified fall, initial encounter: Secondary | ICD-10-CM

## 2023-04-25 DIAGNOSIS — W06XXXA Fall from bed, initial encounter: Secondary | ICD-10-CM | POA: Insufficient documentation

## 2023-04-25 DIAGNOSIS — S0990XA Unspecified injury of head, initial encounter: Secondary | ICD-10-CM | POA: Diagnosis present

## 2023-04-25 DIAGNOSIS — S0083XA Contusion of other part of head, initial encounter: Secondary | ICD-10-CM | POA: Diagnosis not present

## 2023-04-25 DIAGNOSIS — Y92009 Unspecified place in unspecified non-institutional (private) residence as the place of occurrence of the external cause: Secondary | ICD-10-CM | POA: Diagnosis not present

## 2023-04-25 NOTE — ED Triage Notes (Signed)
Patient BIB [facility] from group home. Report given that she had a unwitnessed, mechanical fall out of bed in the group home. Staff heard the fall and went into the pts room. No LOC, pt not on blood thinners .Patient is non verbal. Group home staff at bed side  and reports that patient is at baseline. Patient has a hematoma to right forehead.

## 2023-04-26 ENCOUNTER — Emergency Department: Payer: Medicare Other

## 2023-04-26 DIAGNOSIS — S0083XA Contusion of other part of head, initial encounter: Secondary | ICD-10-CM | POA: Diagnosis not present

## 2023-04-26 MED ORDER — ACETAMINOPHEN 325 MG PO TABS
650.0000 mg | ORAL_TABLET | Freq: Once | ORAL | Status: AC
  Administered 2023-04-26: 650 mg via ORAL
  Filled 2023-04-26: qty 2

## 2023-04-26 MED ORDER — MIDAZOLAM HCL (PF) 10 MG/2ML IJ SOLN
4.0000 mg | INTRAMUSCULAR | Status: AC
  Administered 2023-04-26: 4 mg via NASAL
  Filled 2023-04-26: qty 2

## 2023-04-26 NOTE — ED Provider Notes (Signed)
[facility] Provider Note    Event Date/Time   First MD Initiated Contact with Patient 04/25/23 2336     (approximate)   History   Fall   HPI NORELLE FINKELSTEIN is a 52 y.o. female with extensive congenital chronic issues including intellectual disability, microcephaly, MR, etc.  She presents with a representative from her group home for evaluation after an unwitnessed fall.  The group home representative let me know that she heard a noise in the room and went to check on her and found that the patient was on the floor, all wrapped up in her blankets.  She has an obvious hematoma to the right side of her forehead along the ridge of the orbit of her right eye.  Otherwise the group home staff member reports that the patient is at her baseline, making occasional noises (she is nonverbal) and agitated because of being in the emergency department, but otherwise she seems okay.  She is moving all 4 of her extremities.  She did not appear to lose consciousness it does not appear to be in pain according to the group home member.     Physical Exam   Triage Vital Signs: ED Triage Vitals  Encounter Vitals Group     BP 04/25/23 2345 (!) 180/101     Systolic BP Percentile --      Diastolic BP Percentile --      Pulse Rate 04/25/23 2345 (!) 112     Resp 04/25/23 2345 20     Temp 04/25/23 2345 (!) 97.1 F (36.2 C)     Temp Source 04/25/23 2345 Axillary     SpO2 04/25/23 2345 100 %     Weight 04/25/23 2347 36.2 kg (79 lb 12.8 oz)     Height 04/25/23 2347 1.448 m (4\' 9" )     Head Circumference --      Peak Flow --      Pain Score --      Pain Loc --      Pain Education --      Exclude from Growth Chart --     Most recent vital signs: Vitals:   04/25/23 2345  BP: (!) 180/101  Pulse: (!) 112  Resp: 20  Temp: (!) 97.1 F (36.2 C)  SpO2: 100%    General: Awake, agitated likely due to the circumstances but does not appear to be in distress.  She is responding  and interacting at baseline with the group home staff member. Head:  Patient has a large contusion and soft tissue hematoma along the bridge of her right eye socket.  No other visible trauma on her head or face.   CV:  Good peripheral perfusion.  Resp:  Normal effort.  No accessory muscle usage or intercostal retractions. Abd:  No distention.  Other:  No tenderness to palpation of the cervical spine.  Patient is moving her head and neck all around, flexing extending and rotating from side-to-side, without any apparent pain or difficulty.  She is moving all 4 of her extremities without difficulty.  No visible signs of injury to her extremities.   ED Results / Procedures / Treatments   Labs (all labs ordered are listed, but only abnormal results are displayed) Labs Reviewed - No data to display   RADIOLOGY I viewed and interpreted the patient's head CT and cervical spine CT.  I see no evidence of acute trauma other than the right sided forehead hematoma.  I also read  the radiologist's report, which confirmed no acute findings.   PROCEDURES:  Critical Care performed: No  Procedures    IMPRESSION / MDM / ASSESSMENT AND PLAN / ED COURSE  I reviewed the triage vital signs and the nursing notes.                              Differential diagnosis includes, but is not limited to, fall, contusion, hematoma, skull or orbital fracture, intracranial hemorrhage, C-spine injury.  Patient's presentation is most consistent with acute presentation with potential threat to life or bodily function.  Labs/studies ordered: CT head, CT cervical spine  Interventions/Medications given:  Medications  midazolam PF (VERSED) injection 4 mg (4 mg Nasal Given 04/26/23 0026)  acetaminophen (TYLENOL) tablet 650 mg (650 mg Oral Given 04/26/23 0016)    (Note:  hospital course my include additional interventions and/or labs/studies not listed above.)   Patient seems to be at her baseline.  Vital signs are  abnormal but this is largely due to the difficulty in getting her to remain still when checking her blood pressure.  She is agitated because of being at the hospital.  Her group home representative knows her very well and is very comfortable with her current state, just wants to make sure that everything is okay given her large hematoma on her forehead.  We talked about risks and benefits of imaging.  We agreed on intranasal Versed which worked very well as a Automotive engineer and did not require monitoring.  I did she also excepted 650 mg of Tylenol by mouth for pain.  We were able to get good quality images of her head and cervical spine and there is no evidence of acute injury.  Group home representative is very comfortable taking her home and following up as an outpatient.  I gave my usual and customary follow-up recommendations and return precautions.         FINAL CLINICAL IMPRESSION(S) / ED DIAGNOSES   Final diagnoses:  Fall, initial encounter  Minor head injury, initial encounter  Traumatic hematoma of forehead, initial encounter     Rx / DC Orders   ED Discharge Orders     None        Note:  This document was prepared using Dragon voice recognition software and may include unintentional dictation errors.   Loleta Rose, MD 04/26/23 [id number]

## 2023-04-26 NOTE — Discharge Instructions (Addendum)

## 2023-04-28 NOTE — Group Note (Deleted)

## 2023-09-05 IMAGING — MG MM DIGITAL SCREENING BILAT W/ TOMO AND CAD
8 series · 9 of 24 positions shown · non-contrast
Comparison: Previous exam(s).

CLINICAL DATA: Screening.

EXAM:
DIGITAL SCREENING BILATERAL MAMMOGRAM WITH TOMOSYNTHESIS AND CAD
TECHNIQUE: Bilateral screening digital craniocaudal and mediolateral oblique
mammograms were obtained. Bilateral screening digital breast
tomosynthesis was performed. The images were evaluated with
computer-aided detection.

[R MLO synth-2D]
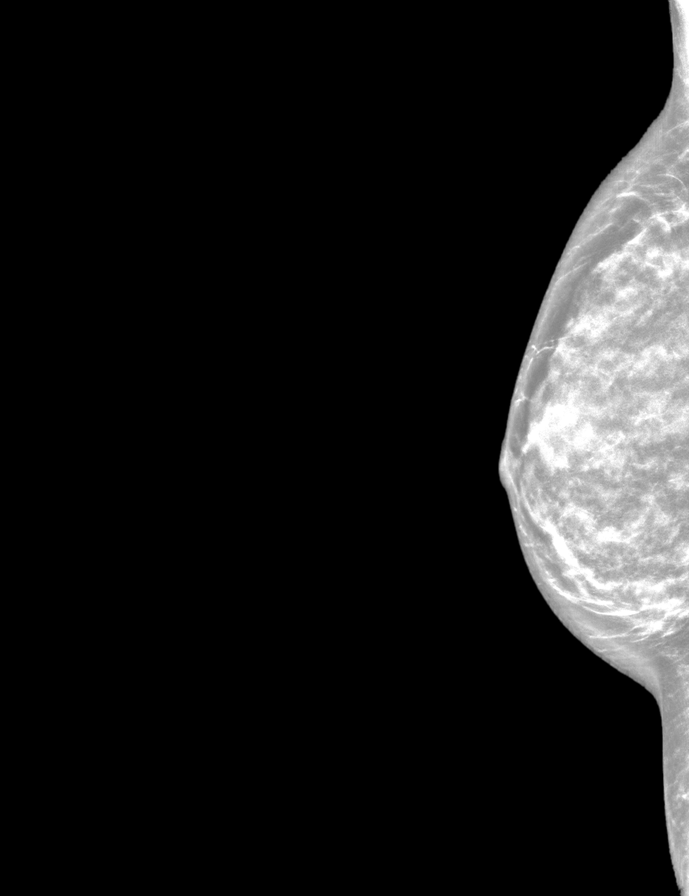

[L CC synth-2D]
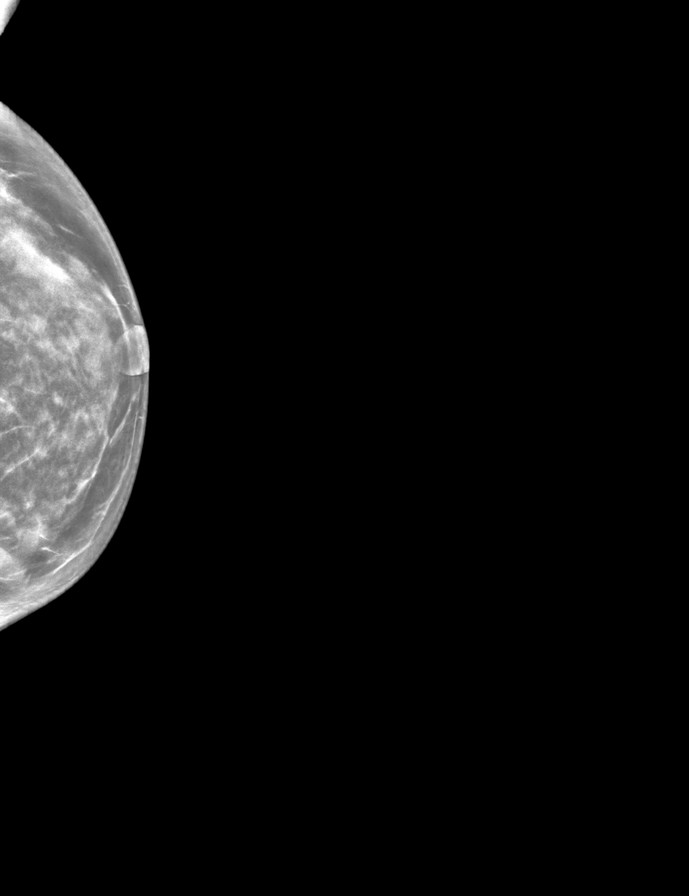

[R CC synth-2D]
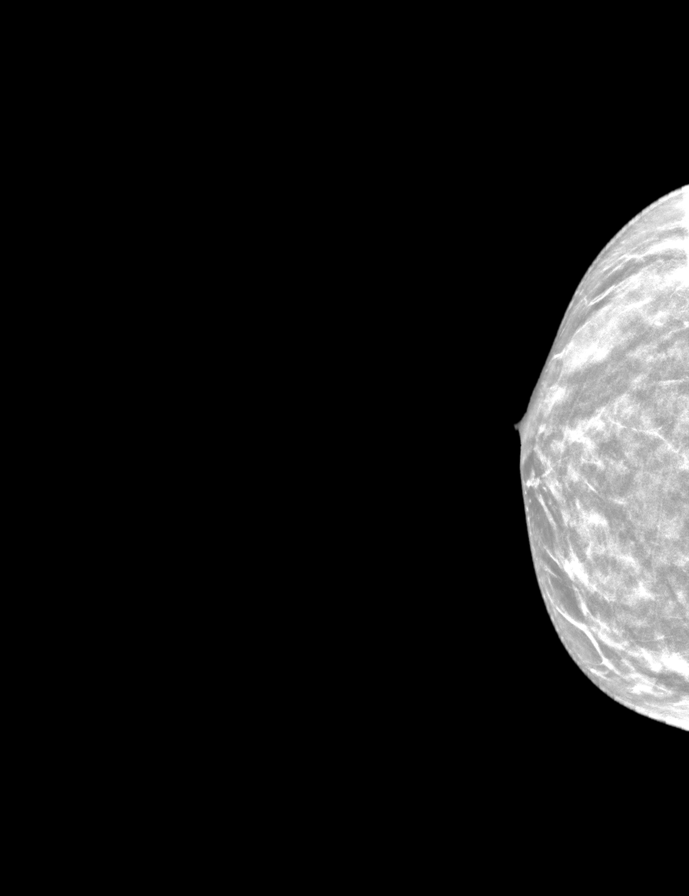

[L MLO synth-2D]
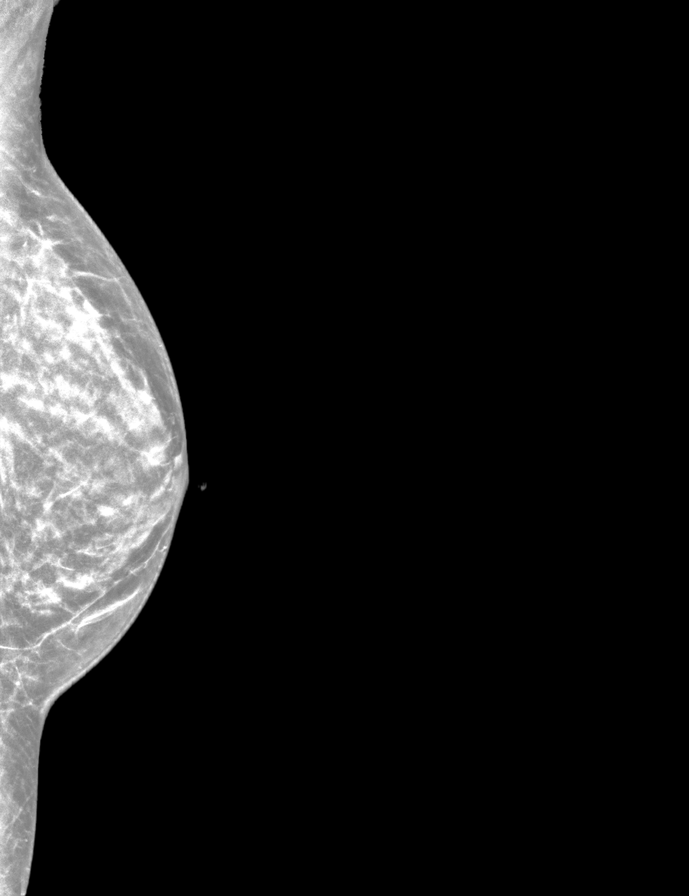

[L CC tomo · 2 of 57 frames shown]
[frame 19/57]
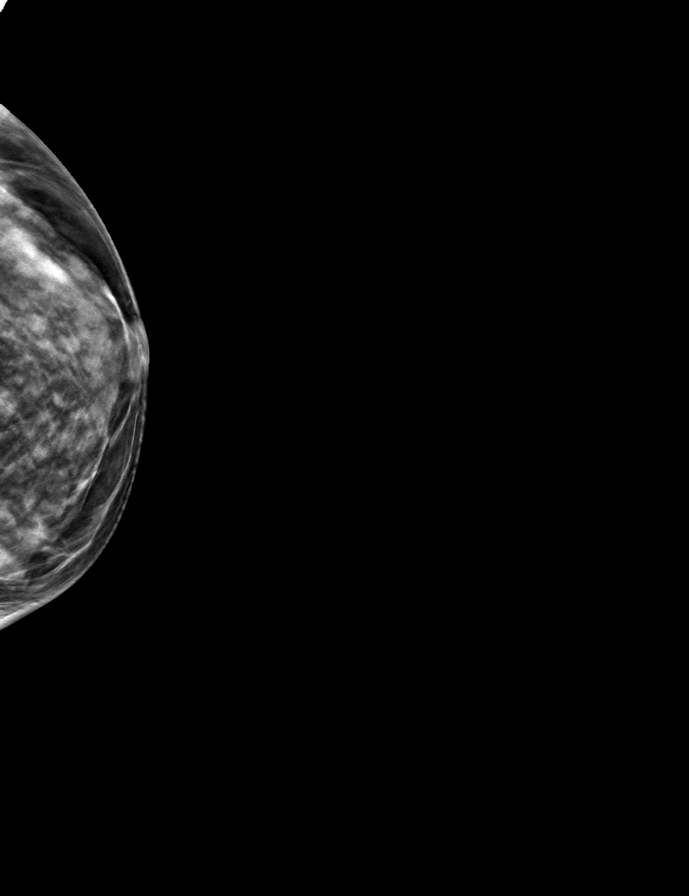
[frame 29/57]
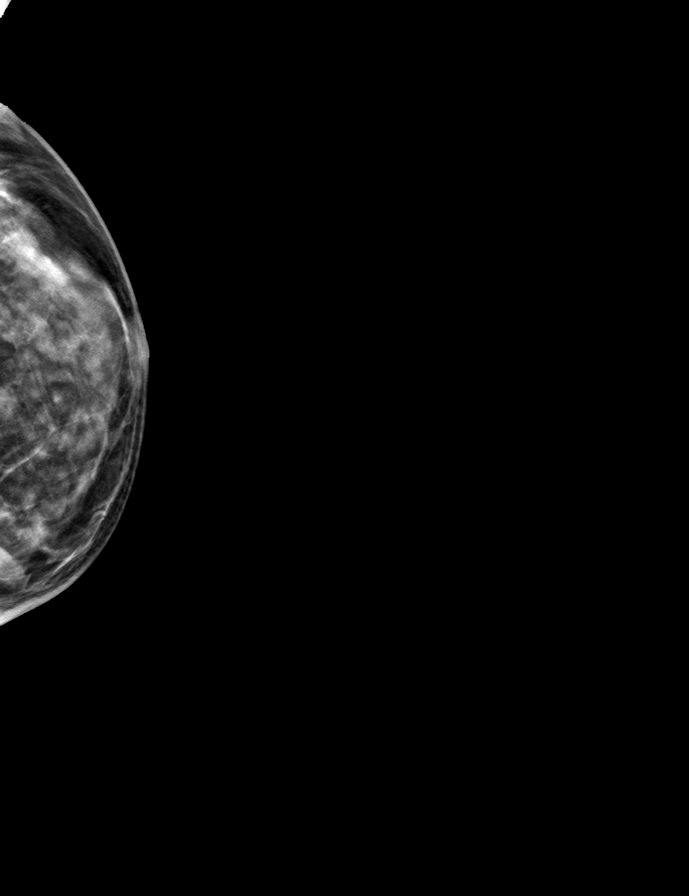

[L MLO tomo · tomo slice 23/45.0]
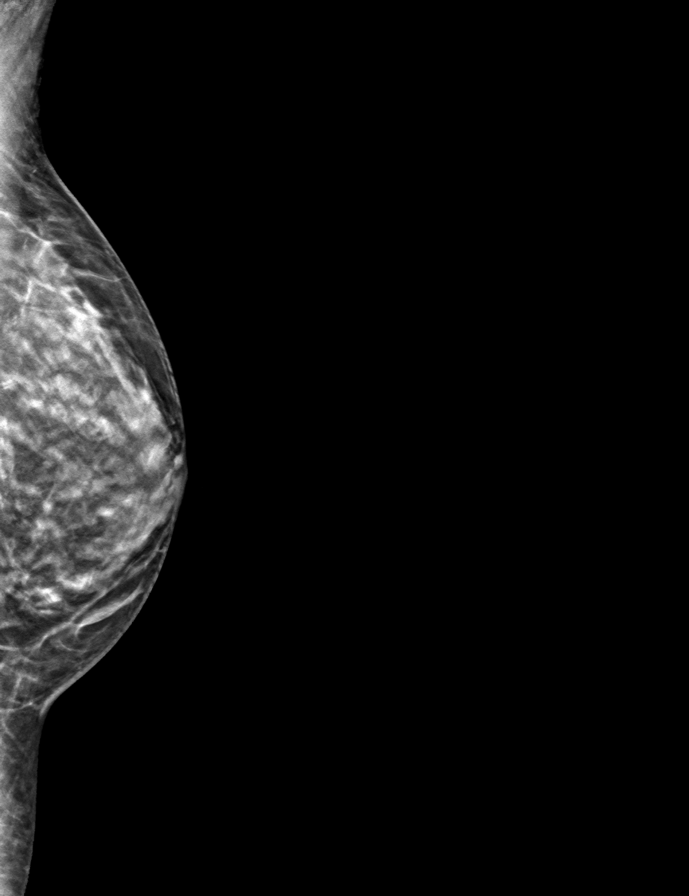

[R MLO tomo · tomo slice 30/59.0]
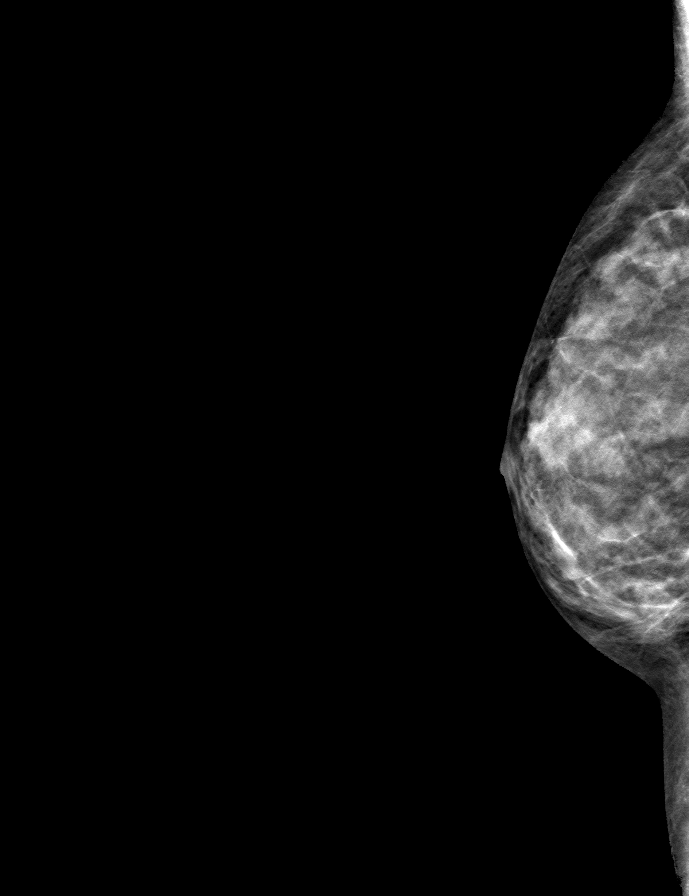

[R CC tomo · tomo slice 27/52.0]
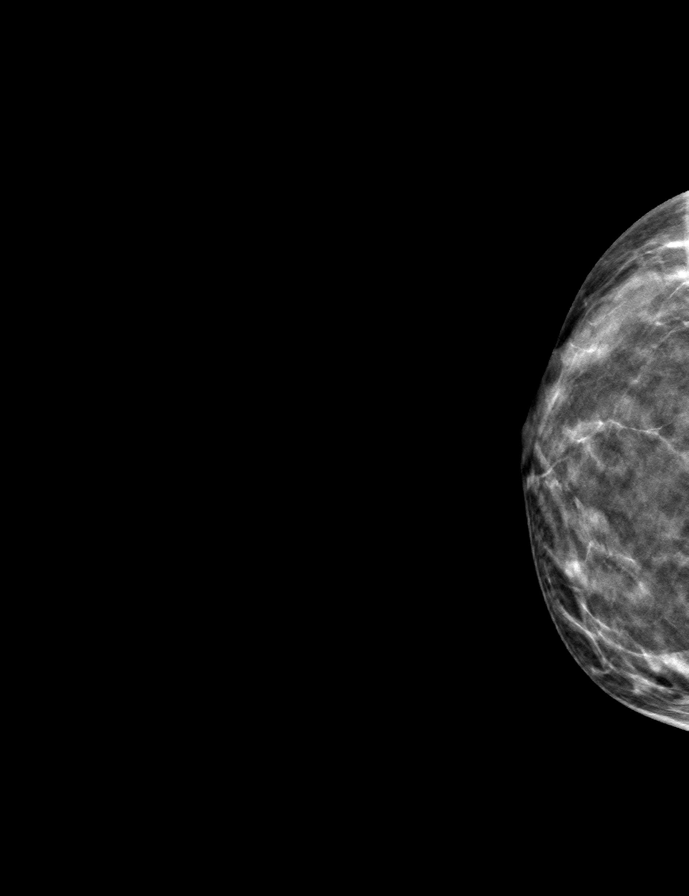

[9 of 24 positions shown; findings below may reference images not displayed]

ACR Breast Density Category c: The breast tissue is heterogeneously
dense, which may obscure small masses.
FINDINGS: There are no findings suspicious for malignancy.
IMPRESSION: No mammographic evidence of malignancy. A result letter of this
screening mammogram will be mailed directly to the patient.

RECOMMENDATION:
Screening mammogram in one year. (Code:Q3-W-BC3)

BI-RADS CATEGORY  1: Negative.

## 2024-03-24 ENCOUNTER — Other Ambulatory Visit: Payer: Self-pay | Admitting: Podiatry

## 2024-03-31 ENCOUNTER — Encounter
Admission: RE | Admit: 2024-03-31 | Discharge: 2024-03-31 | Disposition: A | Discharge status: Home / Self Care | Source: Ambulatory Visit | Attending: Podiatry | Admitting: Podiatry

## 2024-03-31 ENCOUNTER — Other Ambulatory Visit: Payer: Self-pay

## 2024-03-31 DIAGNOSIS — Z01812 Encounter for preprocedural laboratory examination: Secondary | ICD-10-CM

## 2024-03-31 DIAGNOSIS — I10 Essential (primary) hypertension: Secondary | ICD-10-CM

## 2024-03-31 DIAGNOSIS — E119 Type 2 diabetes mellitus without complications: Secondary | ICD-10-CM

## 2024-03-31 HISTORY — DX: Other hammer toe(s) (acquired), left foot: M20.42

## 2024-03-31 HISTORY — DX: Essential (primary) hypertension: I10

## 2024-03-31 HISTORY — DX: Acquired deformities of toe(s), unspecified, unspecified foot: M20.60

## 2024-03-31 NOTE — Patient Instructions (Addendum)
 Your procedure is scheduled on: 04/02/24 - Friday Report to the Registration Desk on the 1st floor of the [facility]. To find out your arrival time, please call [phone] between 1PM - 3PM on: 04/01/24 - Thursday If your arrival time is 6:00 am, do not arrive before that time as the [facility] entrance doors do not open until 6:00 am.  REMEMBER: Instructions that are not followed completely may result in serious medical risk, up to and including death; or upon the discretion of your surgeon and anesthesiologist your surgery may need to be rescheduled.  Do not eat food after midnight the night before surgery.  No gum chewing or hard candies.  You may however, drink CLEAR liquids up to 2 hours before you are scheduled to arrive for your surgery. Do not drink anything within 2 hours of your scheduled arrival time.  Clear liquids include: - water   In addition, your doctor has ordered for you to drink the provided:  Gatorade G2 Drinking this carbohydrate drink up to two hours before surgery helps to reduce insulin  resistance and improve patient outcomes. Please complete drinking 2 hours before scheduled arrival time.  One week prior to surgery: Stop Anti-inflammatories (NSAIDS) such as Advil , Aleve, Ibuprofen , Motrin , Naproxen, Naprosyn and Aspirin based products such as Excedrin, Goody's Powder, BC Powder. You may continue to take Tylenol  if needed for pain up until the day of surgery.  Stop ANY OVER THE COUNTER supplements until after surgery : Cholecalciferol and Zinc Sulfate.  LANTUS  SOLOSTAR - inject 1/2 of your bedtime dose on the night before your surgery.  ON THE DAY OF SURGERY ONLY TAKE THESE MEDICATIONS WITH SIPS OF WATER:  citalopram  (CELEXA )  ipratropium (ATROVENT)  LORazepam  (ATIVAN ) 0.5 mg if needed   No Alcohol for 24 hours before or after surgery.  No Smoking including e-cigarettes for 24 hours before surgery.  No chewable tobacco products for at least 6  hours before surgery.  No nicotine patches on the day of surgery.  Do not use any recreational drugs for at least a week (preferably 2 weeks) before your surgery.  Please be advised that the combination of cocaine and anesthesia may have negative outcomes, up to and including death. If you test positive for cocaine, your surgery will be cancelled.  On the morning of surgery brush your teeth with toothpaste and water, you may rinse your mouth with mouthwash if you wish. Do not swallow any toothpaste or mouthwash.  Use CHG Soap or wipes as directed on instruction sheet.  Do not wear jewelry, make-up, hairpins, clips or nail polish.  For welded (permanent) jewelry: bracelets, anklets, waist bands, etc.  Please have this removed prior to surgery.  If it is not removed, there is a chance that hospital personnel will need to cut it off on the day of surgery.  Do not wear lotions, powders, or perfumes.   Do not shave body hair from the neck down 48 hours before surgery.  Contact lenses, hearing aids and dentures may not be worn into surgery.  Do not bring valuables to the hospital. [facility] is not responsible for any missing/lost belongings or valuables.   Notify your doctor if there is any change in your medical condition (cold, fever, infection).  Wear comfortable clothing (specific to your surgery type) to the hospital.  After surgery, you can help prevent lung complications by doing breathing exercises.  Take deep breaths and cough every 1-2 hours. Your doctor may order a device  called an Facilities manager to help you take deep breaths.  When coughing or sneezing, hold a pillow firmly against your incision with both hands. This is called "splinting." Doing this helps protect your incision. It also decreases belly discomfort.  If you are being admitted to the hospital overnight, leave your suitcase in the car. After surgery it may be brought to your room.  In case of increased  patient census, it may be necessary for you, the patient, to continue your postoperative care in the Same Day Surgery department.  If you are being discharged the day of surgery, you will not be allowed to drive home. You will need a responsible individual to drive you home and stay with you for 24 hours after surgery.   If you are taking public transportation, you will need to have a responsible individual with you.  Please call the Pre-admissions Testing Dept. at [phone] if you have any questions about these instructions.  Surgery Visitation Policy:  Patients having surgery or a procedure may have two visitors.  Children under the age of 50 must have an adult with them who is not the patient.  Inpatient Visitation:    Visiting hours are 7 a.m. to 8 p.m. Up to four visitors are allowed at one time in a patient room. The visitors may rotate out with other people during the day.  One visitor age 53 or older may stay with the patient overnight and must be in the room by 8 p.m.   Merchandiser, retail to address health-related social needs:  [email or web address]     Preparing for Surgery with CHLORHEXIDINE  GLUCONATE (CHG) Soap  Chlorhexidine  Gluconate (CHG) Soap  o An antiseptic cleaner that kills germs and bonds with the skin to continue killing germs even after washing  o Used for showering the night before surgery and morning of surgery  Before surgery, you can play an important role by reducing the number of germs on your skin.  CHG (Chlorhexidine  gluconate) soap is an antiseptic cleanser which kills germs and bonds with the skin to continue killing germs even after washing.  Please do not use if you have an allergy to CHG or antibacterial soaps. If your skin becomes reddened/irritated stop using the CHG.  1. Shower the NIGHT BEFORE SURGERY and the MORNING OF SURGERY with CHG soap.  2. If you choose to wash your hair, wash your hair first as usual  with your normal shampoo.  3. After shampooing, rinse your hair and body thoroughly to remove the shampoo.  4. Use CHG as you would any other liquid soap. You can apply CHG directly to the skin and wash gently with a scrungie or a clean washcloth.  5. Apply the CHG soap to your body only from the neck down. Do not use on open wounds or open sores. Avoid contact with your eyes, ears, mouth, and genitals (private parts). Wash face and genitals (private parts) with your normal soap.  6. Wash thoroughly, paying special attention to the area where your surgery will be performed.  7. Thoroughly rinse your body with warm water.  8. Do not shower/wash with your normal soap after using and rinsing off the CHG soap.  9. Pat yourself dry with a clean towel.  10. Wear clean pajamas to bed the night before surgery.  12. Place clean sheets on your bed the night of your first shower and do not sleep with pets.  13. Shower again with the CHG soap on the  day of surgery prior to arriving at the hospital.  14. Do not apply any deodorants/lotions/powders.  15. Please wear clean clothes to the hospital.

## 2024-03-31 NOTE — Pre-Procedure Instructions (Signed)
 PAT completed with Nichole walker Patients caregiver.

## 2024-04-01 ENCOUNTER — Encounter
Admission: RE | Admit: 2024-04-01 | Discharge: 2024-04-01 | Disposition: A | Discharge status: Home / Self Care | Source: Ambulatory Visit | Attending: Podiatry | Admitting: Podiatry

## 2024-04-01 DIAGNOSIS — Z01810 Encounter for preprocedural cardiovascular examination: Secondary | ICD-10-CM | POA: Diagnosis not present

## 2024-04-01 DIAGNOSIS — Z794 Long term (current) use of insulin: Secondary | ICD-10-CM | POA: Insufficient documentation

## 2024-04-01 DIAGNOSIS — E119 Type 2 diabetes mellitus without complications: Secondary | ICD-10-CM | POA: Insufficient documentation

## 2024-04-01 DIAGNOSIS — I10 Essential (primary) hypertension: Secondary | ICD-10-CM | POA: Diagnosis not present

## 2024-04-01 DIAGNOSIS — R9431 Abnormal electrocardiogram [ECG] [EKG]: Secondary | ICD-10-CM | POA: Insufficient documentation

## 2024-04-01 DIAGNOSIS — Z01818 Encounter for other preprocedural examination: Secondary | ICD-10-CM | POA: Diagnosis present

## 2024-04-01 HISTORY — DX: Legal blindness, as defined in USA: H54.8

## 2024-04-01 HISTORY — DX: Type 2 diabetes mellitus without complications: E11.9

## 2024-04-01 LAB — CBC
HCT: 41.7 % (ref 36.0–46.0)
Hemoglobin: 14 g/dL (ref 12.0–15.0)
MCH: 34.6 pg — ABNORMAL HIGH (ref 26.0–34.0)
MCHC: 33.6 g/dL (ref 30.0–36.0)
MCV: 103 fL — ABNORMAL HIGH (ref 80.0–100.0)
Platelets: 244 K/uL (ref 150–400)
RBC: 4.05 MIL/uL (ref 3.87–5.11)
RDW: 13.4 % (ref 11.5–15.5)
WBC: 10.6 K/uL — ABNORMAL HIGH (ref 4.0–10.5)
nRBC: 0 % (ref 0.0–0.2)

## 2024-04-01 LAB — BASIC METABOLIC PANEL WITH GFR
Anion gap: 13 (ref 5–15)
BUN: 19 mg/dL (ref 6–20)
CO2: 27 mmol/L (ref 22–32)
Calcium: 9.4 mg/dL (ref 8.9–10.3)
Chloride: 103 mmol/L (ref 98–111)
Creatinine, Ser: 0.82 mg/dL (ref 0.44–1.00)
GFR, Estimated: >60 mL/min (ref >60)
Glucose, Bld: 231 mg/dL — ABNORMAL HIGH (ref 70–99)
Potassium: 3.5 mmol/L (ref 3.5–5.1)
Sodium: 143 mmol/L (ref 135–145)

## 2024-04-01 MED ORDER — CEFAZOLIN SODIUM-DEXTROSE 2-4 GM/100ML-% IV SOLN
2.0000 g | INTRAVENOUS | Status: AC
  Administered 2024-04-02: 2 g via INTRAVENOUS

## 2024-04-01 MED ORDER — CHLORHEXIDINE GLUCONATE 0.12 % MT SOLN: 15.0000 mL | Freq: Once | OROMUCOSAL | Status: DC

## 2024-04-01 MED ORDER — ORAL CARE MOUTH RINSE: 15.0000 mL | Freq: Once | OROMUCOSAL | Status: DC

## 2024-04-01 MED ORDER — SODIUM CHLORIDE 0.9 % IV SOLN: INTRAVENOUS | Status: DC

## 2024-04-02 ENCOUNTER — Ambulatory Visit: Admitting: Certified Registered Nurse Anesthetist

## 2024-04-02 ENCOUNTER — Encounter: Payer: Self-pay | Admitting: Podiatry

## 2024-04-02 ENCOUNTER — Ambulatory Visit
Admission: RE | Admit: 2024-04-02 | Discharge: 2024-04-02 | Disposition: A | Discharge status: Home / Self Care | Attending: Podiatry | Admitting: Podiatry

## 2024-04-02 ENCOUNTER — Other Ambulatory Visit: Payer: Self-pay

## 2024-04-02 ENCOUNTER — Encounter
Admission: RE | Disposition: A | Discharge status: Home / Self Care | Payer: Self-pay | Source: Home / Self Care | Attending: Podiatry

## 2024-04-02 ENCOUNTER — Ambulatory Visit: Payer: Self-pay | Admitting: Urgent Care

## 2024-04-02 DIAGNOSIS — R569 Unspecified convulsions: Secondary | ICD-10-CM | POA: Diagnosis not present

## 2024-04-02 DIAGNOSIS — E119 Type 2 diabetes mellitus without complications: Secondary | ICD-10-CM | POA: Diagnosis present

## 2024-04-02 DIAGNOSIS — Z79899 Other long term (current) drug therapy: Secondary | ICD-10-CM | POA: Insufficient documentation

## 2024-04-02 DIAGNOSIS — F419 Anxiety disorder, unspecified: Secondary | ICD-10-CM | POA: Insufficient documentation

## 2024-04-02 DIAGNOSIS — I10 Essential (primary) hypertension: Secondary | ICD-10-CM | POA: Insufficient documentation

## 2024-04-02 DIAGNOSIS — Z7984 Long term (current) use of oral hypoglycemic drugs: Secondary | ICD-10-CM | POA: Diagnosis not present

## 2024-04-02 DIAGNOSIS — M2042 Other hammer toe(s) (acquired), left foot: Secondary | ICD-10-CM | POA: Insufficient documentation

## 2024-04-02 DIAGNOSIS — F79 Unspecified intellectual disabilities: Secondary | ICD-10-CM | POA: Diagnosis not present

## 2024-04-02 HISTORY — PX: AMPUTATION TOE: SHX6595

## 2024-04-02 LAB — GLUCOSE, CAPILLARY
Glucose-Capillary: 113 mg/dL — ABNORMAL HIGH (ref 70–99)
Glucose-Capillary: 119 mg/dL — ABNORMAL HIGH (ref 70–99)

## 2024-04-02 SURGERY — AMPUTATION, TOE
Anesthesia: General | Site: Fifth Toe | Laterality: Left

## 2024-04-02 MED ORDER — MIDAZOLAM HCL 2 MG/2ML IJ SOLN
INTRAMUSCULAR | Status: DC | PRN
  Administered 2024-04-02 (×2): .5 mg via INTRAVENOUS

## 2024-04-02 MED ORDER — FENTANYL CITRATE (PF) 100 MCG/2ML IJ SOLN
INTRAMUSCULAR | Status: AC
  Filled 2024-04-02: qty 2

## 2024-04-02 MED ORDER — PROPOFOL 500 MG/50ML IV EMUL
INTRAVENOUS | Status: DC | PRN
  Administered 2024-04-02: 25 ug/kg/min via INTRAVENOUS

## 2024-04-02 MED ORDER — FENTANYL CITRATE (PF) 100 MCG/2ML IJ SOLN
INTRAMUSCULAR | Status: DC | PRN
  Administered 2024-04-02 (×2): 25 ug via INTRAVENOUS

## 2024-04-02 MED ORDER — BUPIVACAINE HCL 0.5 % IJ SOLN
INTRAMUSCULAR | Status: DC | PRN
  Administered 2024-04-02: 20 mL

## 2024-04-02 MED ORDER — MIDAZOLAM HCL 2 MG/ML PO SYRP
ORAL_SOLUTION | ORAL | Status: AC
  Filled 2024-04-02: qty 5

## 2024-04-02 MED ORDER — 0.9 % SODIUM CHLORIDE (POUR BTL) OPTIME
TOPICAL | Status: DC | PRN
  Administered 2024-04-02: 500 mL

## 2024-04-02 MED ORDER — PROPOFOL 1000 MG/100ML IV EMUL
INTRAVENOUS | Status: AC
  Filled 2024-04-02: qty 100

## 2024-04-02 MED ORDER — MIDAZOLAM HCL 2 MG/2ML IJ SOLN
INTRAMUSCULAR | Status: AC
  Filled 2024-04-02: qty 2

## 2024-04-02 MED ORDER — BUPIVACAINE HCL (PF) 0.5 % IJ SOLN
INTRAMUSCULAR | Status: AC
Start: 2024-04-02 — End: 2024-04-02
  Filled 2024-04-02: qty 30

## 2024-04-02 MED ORDER — MIDAZOLAM HCL 2 MG/ML PO SYRP
7.5000 mg | ORAL_SOLUTION | Freq: Once | ORAL | Status: AC
  Administered 2024-04-02: 7.6 mg via ORAL

## 2024-04-02 MED ORDER — CHLORHEXIDINE GLUCONATE 0.12 % MT SOLN
OROMUCOSAL | Status: AC
  Filled 2024-04-02: qty 15

## 2024-04-02 MED ORDER — CEFAZOLIN SODIUM-DEXTROSE 2-4 GM/100ML-% IV SOLN
INTRAVENOUS | Status: AC
  Filled 2024-04-02: qty 100

## 2024-04-02 MED ORDER — HYDROCODONE-ACETAMINOPHEN 5-325 MG PO TABS: 1.0000 | ORAL_TABLET | Freq: Four times a day (QID) | ORAL | 0 refills | Status: AC | PRN

## 2024-04-02 SURGICAL SUPPLY — 41 items
BLADE MED AGGRESSIVE (BLADE) ×1 IMPLANT
BLADE OSC/SAGITTAL MD 5.5X18 (BLADE) IMPLANT
BLADE SURG 15 STRL LF DISP TIS (BLADE) IMPLANT
BLADE SURG MINI STRL (BLADE) IMPLANT
BNDG ELASTIC 4INX 5YD STR LF (GAUZE/BANDAGES/DRESSINGS) ×1 IMPLANT
BNDG ESMARCH 4X12 STRL LF (GAUZE/BANDAGES/DRESSINGS) ×1 IMPLANT
BNDG GAUZE DERMACEA FLUFF 4 (GAUZE/BANDAGES/DRESSINGS) ×1 IMPLANT
CNTNR URN SCR LID CUP LEK RST (MISCELLANEOUS) ×1 IMPLANT
CUFF TOURN SGL QUICK 12 (TOURNIQUET CUFF) IMPLANT
CUFF TOURN SGL QUICK 18X4 (TOURNIQUET CUFF) IMPLANT
DRAPE FLUOR MINI C-ARM 54X84 (DRAPES) IMPLANT
DURAPREP 26ML APPLICATOR (WOUND CARE) ×1 IMPLANT
ELECTRODE REM PT RTRN 9FT ADLT (ELECTROSURGICAL) ×1 IMPLANT
GAUZE SPONGE 4X4 12PLY STRL (GAUZE/BANDAGES/DRESSINGS) ×2 IMPLANT
GAUZE STRETCH 2X75IN STRL (MISCELLANEOUS) IMPLANT
GAUZE XEROFORM 1X8 LF (GAUZE/BANDAGES/DRESSINGS) ×1 IMPLANT
GLOVE BIO SURGEON STRL SZ7 (GLOVE) ×1 IMPLANT
GLOVE INDICATOR 7.0 STRL GRN (GLOVE) ×1 IMPLANT
GOWN STRL REUS W/ TWL LRG LVL3 (GOWN DISPOSABLE) ×2 IMPLANT
HANDPIECE VERSAJET DEBRIDEMENT (MISCELLANEOUS) IMPLANT
KIT TURNOVER KIT A (KITS) ×1 IMPLANT
LABEL OR SOLS (LABEL) ×1 IMPLANT
MANIFOLD NEPTUNE II (INSTRUMENTS) ×1 IMPLANT
NDL FILTER BLUNT 18X1 1/2 (NEEDLE) ×1 IMPLANT
NDL HYPO 25X1 1.5 SAFETY (NEEDLE) ×2 IMPLANT
NEEDLE FILTER BLUNT 18X1 1/2 (NEEDLE) ×1 IMPLANT
NEEDLE HYPO 25X1 1.5 SAFETY (NEEDLE) ×2 IMPLANT
NS IRRIG 500ML POUR BTL (IV SOLUTION) ×1 IMPLANT
PACK EXTREMITY ARMC (MISCELLANEOUS) ×1 IMPLANT
PAD ABD DERMACEA PRESS 5X9 (GAUZE/BANDAGES/DRESSINGS) IMPLANT
PENCIL SMOKE EVACUATOR (MISCELLANEOUS) ×1 IMPLANT
SOL .9 NS 3000ML IRR UROMATIC (IV SOLUTION) IMPLANT
SOLUTION PREP PVP 2OZ (MISCELLANEOUS) ×1 IMPLANT
STOCKINETTE STRL 6IN 960660 (GAUZE/BANDAGES/DRESSINGS) ×1 IMPLANT
SUT VIC AB 3-0 SH 27X BRD (SUTURE) ×1 IMPLANT
SUT VIC AB 4-0 PS2 27 (SUTURE) IMPLANT
SUTURE EHLN 3-0 FS-10 30 BLK (SUTURE) ×2 IMPLANT
SWAB CULTURE AMIES ANAERIB BLU (MISCELLANEOUS) IMPLANT
SYR 10ML LL (SYRINGE) ×1 IMPLANT
TRAP FLUID SMOKE EVACUATOR (MISCELLANEOUS) ×1 IMPLANT
WATER STERILE IRR 500ML POUR (IV SOLUTION) ×1 IMPLANT

## 2024-04-02 NOTE — Op Note (Signed)
 PODIATRY / FOOT AND ANKLE SURGERY OPERATIVE REPORT    SURGEON: Prentice Lee, DPM  PRE-OPERATIVE DIAGNOSIS:  1.  Left fifth hammertoe  POST-OPERATIVE DIAGNOSIS: Same  PROCEDURE(S): Left fifth toe amputation  HEMOSTASIS: Left ankle tourniquet  ANESTHESIA: MAC  ESTIMATED BLOOD LOSS: 10 cc  FINDING(S): 1.  Contracted left fifth toe, rigid  PATHOLOGY/SPECIMEN(S): Left fifth toe  INDICATIONS:   Joann Mcconnell is a 53 y.o. female who presents with a painful hammertoe contracture of the left fifth toe.  Patient was seen by Dr Neill due to this issue and was referred to me for possible surgical evaluation.  She had discussed performing a left fifth toe amputation with Dr. Neill.  All treatment options were discussed with the patient's family of both conservative and surgical attempts at correction including potential risks and complications.  Patient and family have elected for procedure consisting of left fifth toe amputation.  No guarantees given.  Consent obtained.  No guarantees given   DESCRIPTION: After obtaining full informed written consent, the patient was brought back to the operating room and placed supine upon the operating table.  The patient received IV antibiotics prior to induction.  After obtaining adequate anesthesia, the patient was prepped and draped in the standard fashion.  A preoperative block was performed to the left fifth ray with 20 cc of half percent Marcaine  plain.  An Esmarch bandage used to exsanguinate the left lower extremity pneumatic ankle tourniquet was inflated.  Attention was then directed to the left fifth toe at the fifth metatarsal phalange joint level where a racquet type incision was made starting at the lateral aspect of the fifth metatarsal phalange joint extending around the base the proximal phalanx of the hallux in a circumferential type nature.  The incision is made straight down to bone.  At this time an extensor tenotomy and capsulotomy was  performed followed by release of the collateral and suspensory ligaments as well as any connection to the plantar plate and flexor tendon.  The fifth toe was disarticulated at the fifth metatarsal phalange joint and passed off the operative site and sent off to pathology.  The surgical site was flushed with copious amounts normal sterile saline.  Any bleeding vessels that were notable were cauterized with electrocauterization.  Subcutaneous closure was then obtained with 4-0 Vicryl and skin closure was obtained with 4-0 nylon in a combination of simple and horizontal mattress type stitching.  The pneumatic ankle tourniquet was deflated and prompt hyperemic response was noted all digits left foot.  A postoperative dressing was applied consisting of Xeroform followed by 4 x 4 gauze, gauze roll, Ace wrap.  The patient tolerated the procedure and anesthesia well and was transferred to the recovery room with vital signs stable and vascular status intact to all toes of the left foot.  Following a period of postoperative monitoring the patient be discharged home with the appropriate orders, instructions, medications.  Patient is to follow-up in outpatient clinic in 1 week.  COMPLICATIONS: None  CONDITION: Good, stable  Prentice Lee, DPM

## 2024-04-02 NOTE — H&P (Signed)
 HISTORY AND PHYSICAL INTERVAL NOTE:  04/02/2024  12:26 PM  [name]  has presented today for surgery, with the diagnosis of Hammer toe of left foot  Diabetes mellitus without complication.  The various methods of treatment have been discussed with the patient.  No guarantees were given.  After consideration of risks, benefits and other options for treatment, the patient/family has consented to surgery.  I have reviewed the patients' chart and labs.    PROCEDURE: LEFT 5TH TOE AMPUTATION  A history and physical examination was performed in my office.  The patient was reexamined.  There have been no changes to this history and physical examination.  Joann Mcconnell, DPM

## 2024-04-02 NOTE — Anesthesia Preprocedure Evaluation (Addendum)
 Anesthesia Evaluation  Patient identified by MRN, date of birth, ID bandGeneral Assessment Comment:Non verbal. Makes noises. Does not follow commands. Tremors in upper extremities  Reviewed: Allergy & Precautions, H&P , NPO status , Patient's Chart, lab work & pertinent test results  Airway       Comment: Unable to assess Dental   Missing per family. Unable to assess:   Pulmonary neg pulmonary ROS   Pulmonary exam normal        Cardiovascular Exercise Tolerance: Poor hypertension, Normal cardiovascular exam     Neuro/Psych Seizures -, Well Controlled,  PSYCHIATRIC DISORDERS Anxiety     mental retardation   GI/Hepatic negative GI ROS, Neg liver ROS,,,  Endo/Other  diabetes, Type 2    Renal/GU negative Renal ROS  negative genitourinary   Musculoskeletal   Abdominal Normal abdominal exam  (+)   Peds  Hematology negative hematology ROS (+)   Anesthesia Other Findings Past Medical History: No date: Anxiety No date: Cataract No date: Hammer toe of left foot No date: Hyperlipemia No date: Hypertension 11/08/2018: Infection of wound due to methicillin resistant  Staphylococcus aureus (RIGHT FOOT) No date: Legally blind No date: Microcephaly (HCC) No date: MR (mental retardation) No date: Seizure disorder (HCC) No date: T2DM (type 2 diabetes mellitus) (HCC) No date: Toe deformity     Comment:  Left fifth toe deformity with chronic pain and               dislocation  Past Surgical History: 11/10/2018: IRRIGATION AND DEBRIDEMENT FOOT; Right     Comment:  Procedure: IRRIGATION AND DEBRIDEMENT FOOT;  Surgeon:               Neill Boas, DPM;  Location: [facility] ORS;  Service:               Podiatry;  Laterality: Right; No date: NO PAST SURGERIES     Reproductive/Obstetrics negative OB ROS                              Anesthesia Physical Anesthesia Plan  ASA: 2  Anesthesia Plan: General    Post-op Pain Management: Ofirmev  IV (intra-op)* and Toradol  IV (intra-op)*   Induction: Intravenous  PONV Risk Score and Plan: Propofol  infusion and TIVA  Airway Management Planned: Natural Airway  Additional Equipment:   Intra-op Plan:   Post-operative Plan:   Informed Consent: I have reviewed the patients History and Physical, chart, labs and discussed the procedure including the risks, benefits and alternatives for the proposed anesthesia with the patient or authorized representative who has indicated his/her understanding and acceptance.     Dental Advisory Given and Consent reviewed with POA  Plan Discussed with: CRNA and Surgeon  Anesthesia Plan Comments:          Anesthesia Quick Evaluation

## 2024-04-02 NOTE — Transfer of Care (Signed)
 Immediate Anesthesia Transfer of Care Note  Patient: Joann Mcconnell  Procedure(s) Performed: AMPUTATION, TOE (Left: Fifth Toe)  Patient Location: PACU  Anesthesia Type:General  Level of Consciousness: drowsy  Airway & Oxygen Therapy: Patient Spontanous Breathing and Patient connected to face mask oxygen  Post-op Assessment: Report given to RN and Post -op Vital signs reviewed and stable  Post vital signs: Reviewed and stable  Last Vitals:  Vitals Value Taken Time  BP 139/98 04/02/24 13:30  Temp    Pulse 71 04/02/24 13:38  Resp 11 04/02/24 13:38  SpO2 100 % 04/02/24 13:38  Vitals shown include unfiled device data.  Last Pain:  Vitals:   04/02/24 1120  TempSrc: Temporal         Complications: No notable events documented.

## 2024-04-02 NOTE — Discharge Instructions (Addendum)
 [facility]  POST OPERATIVE INSTRUCTIONS FOR DR. ASHLEY AND DR. Faithanne Verret [facility] PODIATRY DEPARTMENT   Take your medication as prescribed.  Pain medication should be taken only as needed.  May additionally take ibuprofen  as needed, maximum dose is 3200 mg a day.  May take additional Tylenol  as well as needed, maximum dose of Tylenol  or acetaminophen  is 4000 mg a day.  Keep the dressing clean, dry and intact.  May be weightbearing as tolerated in postop shoe.  Keep your foot elevated above the heart level for the first 48 hours.  Continue elevation thereafter to improve swelling.  May also apply ice to the top of the left foot for maximum 10 minutes out of every 1 hour as needed for pain and swelling.  Walking to the bathroom and brief periods of walking are acceptable, unless we have instructed you to be non-weight bearing.  Always wear your post-op shoe when walking.  Always use your crutches if you are to be non-weight bearing.  Do not take a shower. Baths are permissible as long as the foot is kept out of the water.   Every hour you are awake:  Bend your knee 15 times. Flex foot 15 times Massage calf 15 times  Call [facility] [phone]) if any of the following problems occur: You develop a temperature or fever. The bandage becomes saturated with blood. Medication does not stop your pain. Injury of the foot occurs. Any symptoms of infection including redness, odor, or red streaks running from wound.

## 2024-04-02 NOTE — Anesthesia Procedure Notes (Signed)
 Date/Time: 04/02/2024 1:00 PM  Performed by: Duwayne Craven, CRNAPre-anesthesia Checklist: Patient identified, Emergency Drugs available, Suction available, Patient being monitored and Timeout performed Patient Re-evaluated:Patient Re-evaluated prior to induction Oxygen Delivery Method: Simple face mask Induction Type: IV induction Ventilation: Oral airway inserted - appropriate to patient size Placement Confirmation: CO2 detector and positive ETCO2

## 2024-04-05 ENCOUNTER — Encounter: Payer: Self-pay | Admitting: Podiatry

## 2024-04-05 NOTE — Anesthesia Postprocedure Evaluation (Signed)
 Anesthesia Post Note  Patient: Joann Mcconnell  Procedure(s) Performed: AMPUTATION, TOE (Left: Fifth Toe)  Patient location during evaluation: PACU Anesthesia Type: General Level of consciousness: awake Pain management: pain level controlled Vital Signs Assessment: post-procedure vital signs reviewed and stable Respiratory status: spontaneous breathing, nonlabored ventilation and respiratory function stable Cardiovascular status: blood pressure returned to baseline and stable Postop Assessment: no apparent nausea or vomiting Anesthetic complications: no   No notable events documented.   Last Vitals:  Vitals:   04/02/24 1400 04/02/24 1423  BP: (!) 160/88 (!) 118/101  Pulse: 84 79  Resp: 18 15  Temp: 36.9 C (!) 36.1 C  SpO2: 96% 100%    Last Pain:  Vitals:   04/02/24 1423  TempSrc: Temporal  PainSc: 0-No pain                 Camellia Glatter Louder

## 2024-04-06 LAB — SURGICAL PATHOLOGY

## 2024-08-03 NOTE — Progress Notes (Unsigned)
 ANNUAL PREVENTATIVE CARE GYNECOLOGY  ENCOUNTER NOTE  Subjective:       Joann Mcconnell is a 53 y.o. G0P0000 female here for a routine annual gynecologic exam. She has a medical history significant for microcephaly, seizure disorder, mental retardation. Resides in a group home. She is also a patient at the [facility]. The patient has never been sexually active. The patient has never been on hormone replacement therapy. Patient has no history post-menopausal vaginal bleeding.  Has the patient ever been transfused or tattooed?: no.   She is here today with her caretakers who were able to answer all of the questions.      Current complaints: 1.  none   Gynecologic History Patient's last menstrual period was 08/19/2013. Contraception: post menopausal status Last Pap: 11/10/2020. Results were: normal Last mammogram: 01/28/2022. 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    Family History  Problem Relation Age of Onset   Breast cancer Neg Hx     Past Surgical History:  Procedure Laterality Date   AMPUTATION TOE Left 04/02/2024   Procedure: AMPUTATION, TOE;  Surgeon: Lennie Barter, DPM;  Location: [facility] OR[facility];  Service: Orthopedics/Podiatry;  Laterality: Left;   IRRIGATION AND DEBRIDEMENT FOOT Right 11/10/2018   Procedure: IRRIGATION AND DEBRIDEMENT FOOT;  Surgeon: Neill Boas,  DPM;  Location: [facility];  Service: Podiatry;  Laterality: Right;   NO PAST SURGERIES      Social History   Socioeconomic History   Marital status: Single    Spouse name: Not on file   Number of children: Not on file   Years of education: Not on file   Highest education level: Not on file  Occupational History   Not on file  Tobacco Use   Smoking status: Never   Smokeless tobacco: Never  Vaping Use   Vaping status: Never Used  Substance and Sexual Activity   Alcohol use: No   Drug use: No   Sexual activity: Never  Other Topics Concern   Not on file  Social History Narrative   Not on file   Social Drivers of Health   Tobacco Use: Low Risk (08/04/2024)   Patient History    Smoking Tobacco Use: Never    Smokeless Tobacco Use: Never    Passive Exposure: Not on file  Financial Resource Strain: Low Risk  (06/15/2024)   Received from [facility]   Overall Financial Resource Strain (CARDIA)    Difficulty of Paying Living Expenses: Not hard at all  Food Insecurity: No Food Insecurity (06/15/2024)   Received from [facility]   Epic    Within the past 12 months, you worried that your food would run out before you got the  money to buy more.: Never true    Within the past 12 months, the food you bought just didn't last and you didn't have money to get more.: Never true  Transportation Needs: No Transportation Needs (06/15/2024)   Received from [facility] - Transportation    In the past 12 months, has lack of transportation kept you from medical appointments or from getting medications?: No    Lack of Transportation (Non-Medical): No  Physical Activity: Not on file  Stress: Not on file  Social Connections: Not on file  Intimate Partner Violence: Not on file  Depression (EYV7-0): Not on file  Alcohol Screen: Not on file  Housing: Low Risk  (06/15/2024)   Received from [facility]   Epic    In  the last 12 months, was there a time when you were not able to pay the mortgage or rent on time?: No    In the past 12 months, how many times have you moved where you were living?: 0    At any time in the past 12 months, were you homeless or living in a shelter (including now)?: No  Utilities: Not At Risk (06/15/2024)   Received from [facility]   Epic    In the past 12 months has the electric, gas, oil, or water company threatened to shut off services in your home?: No  Health Literacy: Not on file    Medications Ordered Prior to Encounter[1]  Allergies[2]    Review of Systems ROS - Negative per caretakers    Objective:   BP (!) 150/83   Pulse (!) 109   Resp 16   Ht 4' 9 (1.448 m)   Wt 72 lb 12.8 oz (33 kg)   LMP 08/19/2013   BMI 15.75 kg/m  CONSTITUTIONAL: well-nourished female in no acute distress.  PSYCHIATRIC: Limited assessment due to chronic condition. NECK: Normal range of motion, supple, no masses.  Normal thyroid.  SKIN: Skin is warm and dry. No rash noted. Not diaphoretic. No erythema. No pallor. CARDIOVASCULAR: Normal heart rate noted, regular rhythm, no murmur. RESPIRATORY: Clear to auscultation bilaterally. Effort and breath sounds normal, no problems with respiration noted. BREASTS: Symmetric in size. No masses, skin changes, nipple drainage, or lymphadenopathy. ABDOMEN: Soft, normal bowel sounds, no distention noted.  No tenderness, rebound or guarding.  BLADDER: Normal PELVIC: deferred    Labs: Lab Results  Component Value Date   WBC 10.6 (H) 04/01/2024   HGB 14.0 04/01/2024   HCT 41.7 04/01/2024   MCV 103.0 (H) 04/01/2024   PLT 244 04/01/2024    Lab Results  Component Value Date   CREATININE 0.82 04/01/2024   BUN 19 04/01/2024   NA 143 04/01/2024   K 3.5 04/01/2024   CL 103 04/01/2024   CO2 27 04/01/2024     Assessment:   1. Encounter for well woman exam with routine gynecological exam   2. Encounter for screening  mammogram for malignant neoplasm of breast      Plan:  Pap: Not needed Mammogram: Ordered Colon Screening:  under PCP care Labs: under PCP care Return to Clinic - 1 Year  Reviewed with caretakers that we would need to do a Pap at her next annual so that they can be aware and medicate as needed per her PCP.    Damien Parsley, CNM [facility]    [1]  Current Outpatient Medications on File Prior to Visit  Medication  Sig Dispense Refill   acetaminophen  (TYLENOL ) 325 MG tablet Take 650 mg by mouth every 6 (six) hours as needed for moderate pain (pain score 4-6).     Cholecalciferol 50 MCG (2000 UT) CAPS Take 2,000 Units by mouth daily.     citalopram  (CELEXA ) 20 MG tablet Take 30 mg by mouth daily.     docusate sodium  (COLACE) 100 MG capsule Take 100-200 mg by mouth See admin instructions. Take 100 mg in the morning, and 200 mg at bedtime     glimepiride  (AMARYL ) 2 MG tablet Take 2 mg by mouth daily with breakfast.     glucose blood test strip TAKE FINGER STICK BLOOD SUGAR TEST ONCE DAILY     Insulin  Pen Needle (ULTRACARE PEN NEEDLES) 31G X 5 MM MISC USE WITH LANTUS  DAILY     ipratropium (ATROVENT) 0.06 % nasal spray Place 2 sprays into both nostrils 3 (three) times daily.     LANTUS  SOLOSTAR 100 UNIT/ML Solostar Pen Inject 10 Units into the skin every evening.     loratadine  (CLARITIN ) 10 MG tablet Take 10 mg by mouth daily.     LORazepam  (ATIVAN ) 0.5 MG tablet Take 0.5 mg by mouth every 8 (eight) hours as needed for anxiety.     LORazepam  (ATIVAN ) 2 MG tablet Take 2 mg by mouth See admin instructions. Take 1 hour prior to to medical procedures     losartan  (COZAAR ) 25 MG tablet Take 25 mg by mouth daily.     mirtazapine (REMERON) 7.5 MG tablet Take 7.5 mg by mouth at bedtime.     pravastatin  (PRAVACHOL ) 80 MG tablet Take 80 mg by mouth daily.     tolnaftate (TINACTIN) 1 % powder Apply 1 application  topically 3 (three) times a week.     zinc sulfate 220 (50 Zn)  MG capsule Take 1 capsule by mouth daily.     No current facility-administered medications on file prior to visit.  [2] No Known Allergies

## 2024-08-03 NOTE — Patient Instructions (Signed)
 Preventive Care 55-55 Years Old, Female Preventive care refers to lifestyle choices and visits with your health care provider that can promote health and wellness. Preventive care visits are also called wellness exams. What can I expect for my preventive care visit? Counseling Your health care provider may ask you questions about your: Medical history, including: Past medical problems. Family medical history. Pregnancy history. Current health, including: Menstrual cycle. Method of birth control. Emotional well-being. Home life and relationship well-being. Sexual activity and sexual health. Lifestyle, including: Alcohol, nicotine or tobacco, and drug use. Access to firearms. Diet, exercise, and sleep habits. Work and work Astronomer. Sunscreen use. Safety issues such as seatbelt and bike helmet use. Physical exam Your health care provider will check your: Height and weight. These may be used to calculate your BMI (body mass index). BMI is a measurement that tells if you are at a healthy weight. Waist circumference. This measures the distance around your waistline. This measurement also tells if you are at a healthy weight and may help predict your risk of certain diseases, such as type 2 diabetes and high blood pressure. Heart rate and blood pressure. Body temperature. Skin for abnormal spots. What immunizations do I need?  Vaccines are usually given at various ages, according to a schedule. Your health care provider will recommend vaccines for you based on your age, medical history, and lifestyle or other factors, such as travel or where you work. What tests do I need? Screening Your health care provider may recommend screening tests for certain conditions. This may include: Lipid and cholesterol levels. Diabetes screening. This is done by checking your blood sugar (glucose) after you have not eaten for a while (fasting). Pelvic exam and Pap test. Hepatitis B test. Hepatitis C  test. HIV (human immunodeficiency virus) test. STI (sexually transmitted infection) testing, if you are at risk. Lung cancer screening. Colorectal cancer screening. Mammogram. Talk with your health care provider about when you should start having regular mammograms. This may depend on whether you have a family history of breast cancer. BRCA-related cancer screening. This may be done if you have a family history of breast, ovarian, tubal, or peritoneal cancers. Bone density scan. This is done to screen for osteoporosis. Talk with your health care provider about your test results, treatment options, and if necessary, the need for more tests. Follow these instructions at home: Eating and drinking  Eat a diet that includes fresh fruits and vegetables, whole grains, lean protein, and low-fat dairy products. Take vitamin and mineral supplements as recommended by your health care provider. Do not drink alcohol if: Your health care provider tells you not to drink. You are pregnant, may be pregnant, or are planning to become pregnant. If you drink alcohol: Limit how much you have to 0-1 drink a day. Know how much alcohol is in your drink. In the [place] one drink equals one 12 oz bottle of beer (355 mL), one 5 oz glass of wine (148 mL), or one 1 oz glass of hard liquor (44 mL). Lifestyle Brush your teeth every morning and night with fluoride toothpaste. Floss one time each day. Exercise for at least 30 minutes 5 or more days each week. Do not use any products that contain nicotine or tobacco. These products include cigarettes, chewing tobacco, and vaping devices, such as e-cigarettes. If you need help quitting, ask your health care provider. Do not use drugs. If you are sexually active, practice safe sex. Use a condom or other form of protection to  prevent STIs. If you do not wish to become pregnant, use a form of birth control. If you plan to become pregnant, see your health care provider for a  prepregnancy visit. Take aspirin only as told by your health care provider. Make sure that you understand how much to take and what form to take. Work with your health care provider to find out whether it is safe and beneficial for you to take aspirin daily. Find healthy ways to manage stress, such as: Meditation, yoga, or listening to music. Journaling. Talking to a trusted person. Spending time with friends and family. Minimize exposure to UV radiation to reduce your risk of skin cancer. Safety Always wear your seat belt while driving or riding in a vehicle. Do not drive: If you have been drinking alcohol. Do not ride with someone who has been drinking. When you are tired or distracted. While texting. If you have been using any mind-altering substances or drugs. Wear a helmet and other protective equipment during sports activities. If you have firearms in your house, make sure you follow all gun safety procedures. Seek help if you have been physically or sexually abused. What's next? Visit your health care provider once a year for an annual wellness visit. Ask your health care provider how often you should have your eyes and teeth checked. Stay up to date on all vaccines. This information is not intended to replace advice given to you by your health care provider. Make sure you discuss any questions you have with your health care provider. Document Revised: 01/31/2021 Document Reviewed: 01/31/2021 Elsevier Patient Education  2024 [employer] How to Do a Breast Self-Exam Doing breast self-exams can help you stay healthy. They're one way to know what's normal for your breasts. They can help you catch a problem while it's still small and can be treated. You need to: Check your breasts often. Tell your doctor about any changes. You should do breast self-exams even if you have breast implants. What you need: A mirror. A well-lit room. A pillow or other soft object. How to do a  breast self-exam Look for changes  Take off all the clothes above your waist. Stand in front of a mirror in a room with good lighting. Put your hands down at your sides. Compare your breasts in the mirror. Look for difference between them, such as: Differences in shape. Differences in size. Wrinkles, dips, and bumps in one breast and not the other. Look at each breast for skin changes, such as: Redness. Scaly spots. Spots where your skin is thicker. Dimpling. Open sores. Look for changes in your nipples, such as: Fluid coming out of a nipple. Fluid around a nipple. Bleeding. Dimpling. Redness. A nipple that looks pushed in or that has changed position. Feel for changes Lie on your back. Feel each breast. To do this: Pick a breast to feel. Place a pillow under the shoulder closest to that breast. Put the arm closest to that breast behind your head. Feel the breast using the hand of your other arm. Use the pads of your three middle fingers to make small circles starting near the nipple. Use light, medium, and firm pressure. Keep making circles, moving down over the breast. Stop when you feel your ribs. Start making circles with your fingers again, this time going up until you reach your collarbone. Then, make circles out across your breast and into your armpit area. Squeeze your nipple. Check for fluid and lumps. Do these steps again  to check your other breast. Sit or stand in the tub or shower. With soapy water on your skin, feel each breast the same way you did when you were lying down. Write down what you find Writing down what you find can help you keep track of what you want to tell your doctor. Write down: What's normal for each breast. Any changes you find. Write down: The kind of change. If your breast feels tender or painful. Any lump you find. Write down its size and where it is. When you last had your period. General tips If you're breastfeeding, the best time  to check your breasts is after you feed your baby or after you use a breast pump. If you get a period, the best time to check your breasts is 5-7 days after your period ends. With time, you'll get more used to doing the self-exam. You'll also start to know if there are changes in your breasts. Contact a doctor if: You see a change in the shape or size of your breasts or nipples. You see a change in the skin of your breast or nipples. You have fluid coming from your nipples that isn't normal. You find a new lump or thick area. You have breast pain. You have any concerns about your breast health. This information is not intended to replace advice given to you by your health care provider. Make sure you discuss any questions you have with your health care provider. Document Revised: 10/15/2023 Document Reviewed: 10/15/2023 Elsevier Patient Education  2025 [employer].

## 2024-08-04 ENCOUNTER — Encounter: Payer: Self-pay | Admitting: Certified Nurse Midwife

## 2024-08-04 ENCOUNTER — Ambulatory Visit: Admitting: Certified Nurse Midwife

## 2024-08-04 VITALS — BP 150/83 | HR 109 | Resp 16 | Ht <= 58 in | Wt 72.8 lb

## 2024-08-04 DIAGNOSIS — Z13220 Encounter for screening for lipoid disorders: Secondary | ICD-10-CM

## 2024-08-04 DIAGNOSIS — Z131 Encounter for screening for diabetes mellitus: Secondary | ICD-10-CM

## 2024-08-04 DIAGNOSIS — Z01411 Encounter for gynecological examination (general) (routine) with abnormal findings: Secondary | ICD-10-CM

## 2024-08-04 DIAGNOSIS — Z1231 Encounter for screening mammogram for malignant neoplasm of breast: Secondary | ICD-10-CM

## 2024-08-04 DIAGNOSIS — Z01419 Encounter for gynecological examination (general) (routine) without abnormal findings: Secondary | ICD-10-CM | POA: Diagnosis not present
